# Patient Record
Sex: Female | Born: 1995 | Hispanic: No | Marital: Single | Smoking: Former smoker
Health system: Southern US, Community
[De-identification: ages and names within clinical notes are randomized; demographics above are authoritative.]

## PROBLEM LIST (undated history)

## (undated) DIAGNOSIS — F329 Major depressive disorder, single episode, unspecified: Secondary | ICD-10-CM

## (undated) DIAGNOSIS — F419 Anxiety disorder, unspecified: Secondary | ICD-10-CM

## (undated) DIAGNOSIS — F32A Depression, unspecified: Secondary | ICD-10-CM

## (undated) DIAGNOSIS — Q51810 Arcuate uterus: Secondary | ICD-10-CM

## (undated) DIAGNOSIS — G43909 Migraine, unspecified, not intractable, without status migrainosus: Secondary | ICD-10-CM

## (undated) HISTORY — DX: Depression, unspecified: F32.A

## (undated) HISTORY — DX: Anxiety disorder, unspecified: F41.9

## (undated) HISTORY — DX: Arcuate uterus: Q51.810

## (undated) HISTORY — DX: Major depressive disorder, single episode, unspecified: F32.9

## (undated) HISTORY — DX: Migraine, unspecified, not intractable, without status migrainosus: G43.909

---

## 2017-04-16 ENCOUNTER — Ambulatory Visit (INDEPENDENT_AMBULATORY_CARE_PROVIDER_SITE_OTHER): Payer: Managed Care, Other (non HMO) | Admitting: Family Medicine

## 2017-04-16 ENCOUNTER — Ambulatory Visit: Payer: Self-pay | Admitting: Physician Assistant

## 2017-04-16 ENCOUNTER — Encounter: Payer: Self-pay | Admitting: Family Medicine

## 2017-04-16 DIAGNOSIS — F321 Major depressive disorder, single episode, moderate: Secondary | ICD-10-CM | POA: Insufficient documentation

## 2017-04-16 DIAGNOSIS — F329 Major depressive disorder, single episode, unspecified: Secondary | ICD-10-CM | POA: Diagnosis not present

## 2017-04-16 DIAGNOSIS — F419 Anxiety disorder, unspecified: Secondary | ICD-10-CM

## 2017-04-16 DIAGNOSIS — G43909 Migraine, unspecified, not intractable, without status migrainosus: Secondary | ICD-10-CM | POA: Insufficient documentation

## 2017-04-16 DIAGNOSIS — F909 Attention-deficit hyperactivity disorder, unspecified type: Secondary | ICD-10-CM | POA: Diagnosis not present

## 2017-04-16 MED ORDER — LISDEXAMFETAMINE DIMESYLATE 30 MG PO CAPS: 30.0000 mg | ORAL_CAPSULE | Freq: Every day | ORAL | 0 refills | Status: DC

## 2017-04-16 MED ORDER — BUSPIRONE HCL 7.5 MG PO TABS: 7.5000 mg | ORAL_TABLET | Freq: Three times a day (TID) | ORAL | 1 refills | Status: DC

## 2017-04-16 NOTE — Assessment & Plan Note (Signed)
Refill for vyvanse given today. Controlled substance contract reviewed and signed today. Database reviewed without red flags. Informed patient that we would not go to get further refills until obtaining her prior records. Follow-up in one month.

## 2017-04-16 NOTE — Patient Instructions (Signed)
Start the buspar.  Come back to see me in about a month, or sooner as needed.   Take care,  Dr Jimmey Ralph

## 2017-04-16 NOTE — Progress Notes (Signed)
Subjective:  Felicia Ellison is a 21 y.o. female who presents today with a chief complaint of ADHD and to establish care.   HPI:  ADHD, Chronic, New Problem to this provider Patient was diagnosed a few years ago while she was in high school. She has only been on vyvanse in the past. Currently she is on vyvanse  daily which helps with her ability to focus on school and work. She is currently working at [employer]. She will be starting school next semester at [employer]. She will be majoring in [place] and focusing in early childhood development.  No chest pain, shortness of breath, palpitations, insomnia, or decreased appetite.   Depression/Anxiety, Chronic, New problem to this provider Patient with several year history of depression and anxiety. Has been tried on Zoloft in the past which helps with her depressive symptoms, however does not help much with her anxiety symptoms. Overall feels like her anxiety is worse than her depression. Is not currently taking any medication. She does report increased irritability, increased appetite, and sometimes difficulty with sleep. Also reports excessive worry about several things in her life however is usually most focused on her state of health.   Depression screen PHQ 2/9 04/16/2017  Decreased Interest 1  Down, Depressed, Hopeless 2  PHQ - 2 Score 3  Altered sleeping 3  Tired, decreased energy 2  Change in appetite 3  Feeling bad or failure about yourself  2  Trouble concentrating 2  Moving slowly or fidgety/restless 3  Suicidal thoughts 1  PHQ-9 Score 19  Difficult doing work/chores Somewhat difficult    GAD 7 : Generalized Anxiety Score 04/16/2017  Nervous, Anxious, on Edge 2  Control/stop worrying 3  Worry too much - different things 3  Trouble relaxing 2  Restless 1  Easily annoyed or irritable 2  Afraid - awful might happen 3  Total GAD 7 Score 16  Anxiety Difficulty Somewhat difficult   ROS: No SI or HI.  ROS: Per HPI,  otherwise a 14 point review of systems was performed and was negative  PMH:  The following were reviewed and entered/updated in epic: History reviewed. No pertinent past medical history. Patient Active Problem List   Diagnosis Date Noted  . ADHD 04/16/2017  . Migraines 04/16/2017  . Anxiety and depression 04/16/2017   History reviewed. No pertinent surgical history.  Family History  Problem Relation Age of Onset  . Hypothyroidism Mother   . Anxiety disorder Mother   . Anxiety disorder Father   . Cancer Maternal Grandmother        Brain  . Hypertension Paternal Grandmother   . Heart attack Paternal Grandfather    Medications- reviewed and updated Current Outpatient Prescriptions  Medication Sig Dispense Refill  . lisdexamfetamine (VYVANSE) 30 MG capsule Take 1 capsule (30 mg total) by mouth daily. 30 capsule 0  . busPIRone (BUSPAR) 7.5 MG tablet Take 1 tablet (7.5 mg total) by mouth 3 (three) times daily. 90 tablet 1   No current facility-administered medications for this visit.    Allergies-reviewed and updated No Known Allergies  Social History   Social History  . Marital status: Single    Spouse name: N/A  . Number of children: N/A  . Years of education: N/A   Occupational History  . Party Host    Social History Main Topics  . Smoking status: Current Every Day Smoker  . Smokeless tobacco: Never Used  . Alcohol use Yes  . Drug use: No  .  Sexual activity: Yes    Partners: Female   Other Topics Concern  . None   Social History Narrative  . None   Objective:  Physical Exam: BP 110/80   Pulse 86   Ht  (1.651 m)   Wt 198 lb 9.6 oz (90.1 kg)   SpO2 99%   Breastfeeding? Unknown   BMI 33.05 kg/m   Gen: NAD, resting comfortably CV: RRR with no murmurs appreciated Pulm: NWOB, CTAB with no crackles, wheezes, or rhonchi GI: Normal bowel sounds present. Soft, Nontender, Nondistended. MSK: No edema, cyanosis, or clubbing noted Skin: Warm, dry Neuro:  Grossly normal, moves all extremities Psych: Normal affect and thought content  Assessment/Plan:  ADHD Refill for vyvanse given today. Controlled substance contract reviewed and signed today. Database reviewed without red flags. Informed patient that we would not go to get further refills until obtaining her prior records. Follow-up in one month.  Anxiety and depression PHQ and GAD elevated today. Discussed treatment options with patient. She deferred starting SSRI/SNRI today. Will start buspar today. Follow up in 1 month. Consider addition of SSRI/SNRI if symptoms are not improving on buspar alone. Referral to behavioral health placed today.   Obtain records from her previous PCP. Consider CBC and TSH in the near future if it has not been done recently.  Preventative Healthcare Flu shot deferred today.  Katina Degree. Jimmey Ralph, MD 04/16/2017 1:53 PM

## 2017-04-16 NOTE — Assessment & Plan Note (Addendum)
PHQ and GAD elevated today. Discussed treatment options with patient. She deferred starting SSRI/SNRI today. Will start buspar today. Follow up in 1 month. Consider addition of SSRI/SNRI if symptoms are not improving on buspar alone. Referral to behavioral health placed today.   Obtain records from her previous PCP. Consider CBC and TSH in the near future if it has not been done recently.

## 2017-05-09 ENCOUNTER — Telehealth: Payer: Self-pay | Admitting: Family Medicine

## 2017-05-09 NOTE — Telephone Encounter (Signed)
ROI faxed to [facility]

## 2017-05-16 ENCOUNTER — Telehealth: Payer: Self-pay | Admitting: Family Medicine

## 2017-05-16 NOTE — Telephone Encounter (Signed)
MEDICATION: lisdexamfetamine (VYVANSE) 30 MG capsule    PHARMACY: picking up paper script   IS THIS A 90 DAY SUPPLY : no  IS PATIENT OUT OF MEDICATION: no  IF NOT; HOW MUCH IS LEFT: 1 week  LAST APPOINTMENT DATE: @  NEXT APPOINTMENT DATE:@11 /16/2018 m OTHER COMMENTS: Wants to know if she can pick it up tomorrow   **Let patient know to contact pharmacy at the end of the day to make sure medication is ready. **  ** Please notify patient to allow 48-72 hours to process**  **Encourage patient to contact the pharmacy for refills or they can request refills through [facility]**

## 2017-05-17 ENCOUNTER — Ambulatory Visit (INDEPENDENT_AMBULATORY_CARE_PROVIDER_SITE_OTHER): Payer: BLUE CROSS/BLUE SHIELD | Admitting: Family Medicine

## 2017-05-17 ENCOUNTER — Encounter: Payer: Self-pay | Admitting: Family Medicine

## 2017-05-17 VITALS — BP 100/80 | HR 75 | Ht 65.0 in | Wt 190.8 lb

## 2017-05-17 DIAGNOSIS — F321 Major depressive disorder, single episode, moderate: Secondary | ICD-10-CM | POA: Diagnosis not present

## 2017-05-17 DIAGNOSIS — N946 Dysmenorrhea, unspecified: Secondary | ICD-10-CM

## 2017-05-17 DIAGNOSIS — F419 Anxiety disorder, unspecified: Secondary | ICD-10-CM | POA: Insufficient documentation

## 2017-05-17 DIAGNOSIS — F909 Attention-deficit hyperactivity disorder, unspecified type: Secondary | ICD-10-CM

## 2017-05-17 MED ORDER — LISDEXAMFETAMINE DIMESYLATE 30 MG PO CAPS: 30.0000 mg | ORAL_CAPSULE | Freq: Every day | ORAL | 0 refills | Status: DC

## 2017-05-17 NOTE — Patient Instructions (Signed)
We will continue the vyvanse.  Let me know how your visit with Misty StanleyLisa goes.  I placed a referal to a gynecologist.  Come back to see me in 3 months, or sooner as needed.  Take care,  Dr Jimmey RalphParker

## 2017-05-17 NOTE — Assessment & Plan Note (Signed)
See depression A/P. 

## 2017-05-17 NOTE — Assessment & Plan Note (Signed)
Vyvanse given today.  Database reviewed without red flags. F/u 3 months.

## 2017-05-17 NOTE — Progress Notes (Signed)
    Subjective:  Felicia Ellison is a 21 y.o. female who presents today with a chief complaint of ADHD follow-up.   HPI:  ADHD, chronic problem, stable Currently on Vyvanse 30 mg daily.  She does well on this dose.  She recently changed jobs from [employer] to [employer] and thinks it is been a good change.  Medications help her with her ability to function at work.  No palpitations.  No chest pain.    Dysmenorrhea, chronic problem, new this provider Several year history.  Patient has irregular, long and painful cycles.  She has been evaluated by gynecology in the past and has been on oral birth control pills which did not significantly seem to help.  She would like to be referred to gynecologist for further management.  Depression/Anxiety, Chronic, Stable Patient was seen about a month ago and started on BuSpar.  Reports that she did not tolerate this dose and noticed dizziness for about 30 minutes after taking medications.  She has not been on anything for a couple weeks.  Currently is not interested in pharmacotherapy, she has an appointment with our therapist in a few weeks and would like to wait till after that visit to determine if she needs any further medication.  School is going well.  She thinks that her recent switch of jobs has been helping with her anxiety and depression.  No SI or HI.  ROS: Per HPI  PMH: Smoking history reviewed.  Current smoker.  Objective:  Physical Exam: BP 100/80   Pulse 75   Ht 5\' 5"  (1.651 m)   Wt 190 lb 12.8 oz (86.5 kg)   SpO2 99%   BMI 31.75 kg/m   Gen: NAD, resting comfortably Skin: Warm, dry Neuro: Grossly normal, moves all extremities Psych: Normal affect and thought content   Assessment/Plan:  ADHD Vyvanse given today.  Database reviewed without red flags. F/u 3 months.   Depression, major, single episode, moderate (HCC) Patient deferred pharmacological therapy today.  No SI or HI.  She will be seeing her therapist in a few weeks.  Advised  patient return within the next few weeks depending on how her visit with a therapist ago.  Consider starting SSRI if symptoms not well managed with psychotherapy alone.  Anxiety See depression A/P.  Dysmenorrhea Referral to OB/GYN placed.  Patient would be a good candidate for IUD placement.  Encouraged use of NSAIDs as needed.  [name], MD 05/17/2017 5:18 PM

## 2017-05-17 NOTE — Telephone Encounter (Signed)
Pending appt today

## 2017-05-17 NOTE — Assessment & Plan Note (Signed)
Patient deferred pharmacological therapy today.  No SI or HI.  She will be seeing her therapist in a few weeks.  Advised patient return within the next few weeks depending on how her visit with a therapist ago.  Consider starting SSRI if symptoms not well managed with psychotherapy alone.

## 2017-06-04 ENCOUNTER — Ambulatory Visit (INDEPENDENT_AMBULATORY_CARE_PROVIDER_SITE_OTHER): Payer: 59 | Admitting: Psychology

## 2017-06-04 DIAGNOSIS — F411 Generalized anxiety disorder: Secondary | ICD-10-CM | POA: Diagnosis not present

## 2017-06-14 ENCOUNTER — Other Ambulatory Visit: Payer: Self-pay | Admitting: Family Medicine

## 2017-06-14 NOTE — Telephone Encounter (Signed)
Copied from CRM [id number]. Topic: Quick Communication - Rx Refill/Question >> Jun 14, 2017  9:32 AM Oneal GroutSebastian, Jennifer S wrote: Has the patient contacted their pharmacy? No, patient states she must request from us and pick up in office  (Agent: If no, request that the patient contact the pharmacy for the refill.)   [facility]erred [facility] (with phone number [facility]    Agent: Please be advised that RX refills may take up to 3 business days. We ask that you follow-up with your pharmacy. Requesting refill on lisdexamfetamine (VYVANSE) 30 MG capsule

## 2017-06-17 ENCOUNTER — Telehealth: Payer: Self-pay | Admitting: Family Medicine

## 2017-06-17 MED ORDER — LISDEXAMFETAMINE DIMESYLATE 30 MG PO CAPS: 30.0000 mg | ORAL_CAPSULE | Freq: Every day | ORAL | 0 refills | Status: DC

## 2017-06-17 NOTE — Telephone Encounter (Signed)
I sent this into the pharmacy earlier this morning. She should check with them to make sure they got it.  [name], MD 06/17/2017 12:11 PM

## 2017-06-17 NOTE — Addendum Note (Signed)
Addended by: Ardith DarkPARKER, Londyn Wotton M on: 06/17/2017 09:58 AM   Modules accepted: Orders

## 2017-06-17 NOTE — Telephone Encounter (Signed)
Rx sent in electronically.   [name], MD 06/17/2017 9:57 AM

## 2017-06-17 NOTE — Telephone Encounter (Signed)
Copied from CRM [id number]. Topic: Quick Communication - Rx Refill/Question >> Jun 14, 2017  9:32 AM Oneal GroutSebastian, Jennifer S wrote: Has the patient contacted their pharmacy? No, patient states she must request from us and pick up in office  (Agent: If no, request that the patient contact the pharmacy for the refill.)   [facility]    Agent: Please be advised that RX refills may take up to 3 business days. We ask that you follow-up with your pharmacy. Requesting refill on lisdexamfetamine (VYVANSE) 30 MG capsule >> Jun 17, 2017 10:55 AM Oneal GroutSebastian, Jennifer S wrote: Checking status? Please advise [phone]

## 2017-06-18 ENCOUNTER — Ambulatory Visit: Payer: Managed Care, Other (non HMO) | Admitting: Psychology

## 2017-06-18 NOTE — Telephone Encounter (Signed)
Called patient and let her know that her prescription was sent in to pharmacy.

## 2017-06-19 NOTE — Telephone Encounter (Signed)
Left message for patient to return call to make sure she is aware her medication was sent to the pharmacy.  Dr. Jimmey RalphParker is now able to e-scribe that medication.  CRM created.

## 2017-06-19 NOTE — Telephone Encounter (Signed)
Called patient she has picked up meds and did not have any questions at this time.

## 2017-07-02 DIAGNOSIS — Q51810 Arcuate uterus: Secondary | ICD-10-CM

## 2017-07-02 HISTORY — DX: Arcuate uterus: Q51.810

## 2017-07-05 ENCOUNTER — Ambulatory Visit (INDEPENDENT_AMBULATORY_CARE_PROVIDER_SITE_OTHER): Payer: 59 | Admitting: Psychology

## 2017-07-05 DIAGNOSIS — F411 Generalized anxiety disorder: Secondary | ICD-10-CM

## 2017-07-10 ENCOUNTER — Other Ambulatory Visit: Payer: Self-pay | Admitting: Family Medicine

## 2017-07-11 MED ORDER — LISDEXAMFETAMINE DIMESYLATE 30 MG PO CAPS: 30.0000 mg | ORAL_CAPSULE | Freq: Every day | ORAL | 0 refills | Status: DC

## 2017-07-11 NOTE — Telephone Encounter (Signed)
Patient has follow up scheduled in February.

## 2017-07-11 NOTE — Telephone Encounter (Signed)
Please advise 

## 2017-07-15 ENCOUNTER — Other Ambulatory Visit: Payer: Self-pay | Admitting: Family Medicine

## 2017-07-15 ENCOUNTER — Ambulatory Visit (INDEPENDENT_AMBULATORY_CARE_PROVIDER_SITE_OTHER): Payer: 59 | Admitting: Psychology

## 2017-07-15 DIAGNOSIS — F411 Generalized anxiety disorder: Secondary | ICD-10-CM | POA: Diagnosis not present

## 2017-07-15 MED ORDER — LISDEXAMFETAMINE DIMESYLATE 30 MG PO CAPS: 30.0000 mg | ORAL_CAPSULE | Freq: Every day | ORAL | 0 refills | Status: DC

## 2017-07-15 NOTE — Progress Notes (Signed)
Reordering vyvanse - was not properly received by pharmacy.  [name], MD 07/15/2017 9:53 AM

## 2017-08-03 ENCOUNTER — Other Ambulatory Visit: Payer: Self-pay | Admitting: Family Medicine

## 2017-08-04 ENCOUNTER — Other Ambulatory Visit: Payer: Self-pay | Admitting: Family Medicine

## 2017-08-05 ENCOUNTER — Ambulatory Visit (INDEPENDENT_AMBULATORY_CARE_PROVIDER_SITE_OTHER): Payer: 59 | Admitting: Psychology

## 2017-08-05 DIAGNOSIS — F411 Generalized anxiety disorder: Secondary | ICD-10-CM | POA: Diagnosis not present

## 2017-08-05 MED ORDER — LISDEXAMFETAMINE DIMESYLATE 30 MG PO CAPS: 30.0000 mg | ORAL_CAPSULE | Freq: Every day | ORAL | 0 refills | Status: DC

## 2017-08-05 NOTE — Telephone Encounter (Signed)
Rx sent in.  [name], MD 08/05/2017 8:39 AM

## 2017-08-05 NOTE — Telephone Encounter (Signed)
Please advise.  Patient has follow up scheduled on 08/19/2017.

## 2017-08-12 ENCOUNTER — Encounter: Payer: Self-pay | Admitting: Family Medicine

## 2017-08-12 ENCOUNTER — Ambulatory Visit: Payer: BLUE CROSS/BLUE SHIELD | Admitting: Family Medicine

## 2017-08-12 ENCOUNTER — Ambulatory Visit (INDEPENDENT_AMBULATORY_CARE_PROVIDER_SITE_OTHER): Payer: 59 | Admitting: Psychology

## 2017-08-12 DIAGNOSIS — F411 Generalized anxiety disorder: Secondary | ICD-10-CM

## 2017-08-13 ENCOUNTER — Ambulatory Visit (INDEPENDENT_AMBULATORY_CARE_PROVIDER_SITE_OTHER): Payer: Managed Care, Other (non HMO) | Admitting: Family Medicine

## 2017-08-13 ENCOUNTER — Encounter: Payer: Self-pay | Admitting: Family Medicine

## 2017-08-13 ENCOUNTER — Other Ambulatory Visit (HOSPITAL_COMMUNITY)
Admission: RE | Admit: 2017-08-13 | Discharge: 2017-08-13 | Disposition: A | Discharge status: Home / Self Care | Payer: BLUE CROSS/BLUE SHIELD | Source: Ambulatory Visit | Attending: Family Medicine | Admitting: Family Medicine

## 2017-08-13 VITALS — BP 110/70 | HR 104 | Temp 98.7°F | Ht 65.0 in | Wt 171.0 lb

## 2017-08-13 DIAGNOSIS — Z23 Encounter for immunization: Secondary | ICD-10-CM

## 2017-08-13 DIAGNOSIS — F909 Attention-deficit hyperactivity disorder, unspecified type: Secondary | ICD-10-CM

## 2017-08-13 DIAGNOSIS — Z114 Encounter for screening for human immunodeficiency virus [HIV]: Secondary | ICD-10-CM | POA: Diagnosis not present

## 2017-08-13 DIAGNOSIS — Z118 Encounter for screening for other infectious and parasitic diseases: Secondary | ICD-10-CM

## 2017-08-13 NOTE — Assessment & Plan Note (Signed)
Stable.  Continue Vyvanse.  Follow-up in 6 months.

## 2017-08-13 NOTE — Patient Instructions (Addendum)
Come back to see me in 6 months, or sooner as needed.  Take care, Dr Lynnell DikeParkre

## 2017-08-13 NOTE — Progress Notes (Signed)
    Subjective:  Felicia Ellison is a 22 y.o. female who presents today with a chief complaint of ADHD.   HPI:  ADHD, established problem, stable On Vyvanse 30 mg daily.  Tolerates this dose well without side effects.  No chest pain or shortness of breath.  No palpitations.  No weight loss.  No irritability or insomnia.  Helps her perform at work.  Currently works at [employer].  ROS: Per HPI  Objective:  Physical Exam: BP 110/70 (BP Location: Left Arm, Patient Position: Sitting, Cuff Size: Normal)   Pulse (!) 104   Temp 98.7 F (37.1 C) (Oral)   Ht 5\' 5"  (1.651 m)   Wt 171 lb (77.6 kg)   LMP 08/06/2017   SpO2 97%   BMI 28.46 kg/m   Gen: NAD, resting comfortably CV: RRR with no murmurs appreciated Pulm: NWOB, CTAB with no crackles, wheezes, or rhonchi  Assessment/Plan:  ADHD Stable.  Continue Vyvanse.  Follow-up in 6 months.  Preventive healthcare Check chlamydia screen.  Check HIV antibody.  Tdap given today.  [name], MD 08/13/2017 3:51 PM

## 2017-08-14 ENCOUNTER — Other Ambulatory Visit (INDEPENDENT_AMBULATORY_CARE_PROVIDER_SITE_OTHER): Payer: BLUE CROSS/BLUE SHIELD

## 2017-08-14 DIAGNOSIS — Z114 Encounter for screening for human immunodeficiency virus [HIV]: Secondary | ICD-10-CM | POA: Diagnosis not present

## 2017-08-15 LAB — HIV ANTIBODY (ROUTINE TESTING W REFLEX): HIV: NONREACTIVE

## 2017-08-15 LAB — URINE CYTOLOGY ANCILLARY ONLY: Chlamydia: NEGATIVE

## 2017-08-15 NOTE — Progress Notes (Signed)
Dr Lavone NeriParker's interpretation of your lab work:  Your HIV screening test is negative.    If you have any additional questions, please give us a call or send us a message through [facility].  Take care, Dr Jimmey RalphParker

## 2017-08-15 NOTE — Progress Notes (Signed)
Dr Lavone NeriParker's interpretation of your lab work:  Your screening test for chlamydia is negative.    If you have any additional questions, please give us a call or send us a message through [facility].  Take care, Dr Jimmey RalphParker

## 2017-08-19 ENCOUNTER — Ambulatory Visit: Payer: BLUE CROSS/BLUE SHIELD | Admitting: Family Medicine

## 2017-08-19 ENCOUNTER — Ambulatory Visit (INDEPENDENT_AMBULATORY_CARE_PROVIDER_SITE_OTHER): Payer: 59 | Admitting: Psychology

## 2017-08-19 DIAGNOSIS — Z0289 Encounter for other administrative examinations: Secondary | ICD-10-CM

## 2017-08-19 DIAGNOSIS — F411 Generalized anxiety disorder: Secondary | ICD-10-CM

## 2017-08-21 ENCOUNTER — Encounter: Payer: Self-pay | Admitting: Family Medicine

## 2017-08-23 ENCOUNTER — Ambulatory Visit (INDEPENDENT_AMBULATORY_CARE_PROVIDER_SITE_OTHER): Payer: Managed Care, Other (non HMO) | Admitting: Obstetrics and Gynecology

## 2017-08-23 ENCOUNTER — Encounter: Payer: Self-pay | Admitting: Obstetrics and Gynecology

## 2017-08-23 ENCOUNTER — Other Ambulatory Visit: Payer: Self-pay

## 2017-08-23 VITALS — BP 114/72 | HR 72 | Resp 16 | Ht 65.0 in | Wt 169.0 lb

## 2017-08-23 DIAGNOSIS — N941 Unspecified dyspareunia: Secondary | ICD-10-CM

## 2017-08-23 DIAGNOSIS — N926 Irregular menstruation, unspecified: Secondary | ICD-10-CM

## 2017-08-23 DIAGNOSIS — N921 Excessive and frequent menstruation with irregular cycle: Secondary | ICD-10-CM | POA: Diagnosis not present

## 2017-08-23 DIAGNOSIS — N946 Dysmenorrhea, unspecified: Secondary | ICD-10-CM

## 2017-08-23 DIAGNOSIS — Z113 Encounter for screening for infections with a predominantly sexual mode of transmission: Secondary | ICD-10-CM

## 2017-08-23 NOTE — Progress Notes (Signed)
22 y.o. 720P0 Single Caucasian female here for dysmenorrhea and pain during and after intercourse.    Menarche age 22.   Weighed 220 in July 2018 [place] Felt depressed and lost weight.  Depression is better.  Sees Samule DryLisa Forez, psychologist at [facility].   Can skip  1 - 2 months.  Out of 12 months, have 6 - 7 menses.  Flow can require tampon change up to every 1 - 2 hours.  Painful menses. Uses Ibuprofen or Excedrin almost daily due to her headaches.   This can help her menstrual pain.  Hot bath is the only thing that helps on her worst day.  Nausea, vomiting, and migraine with menses.   Tired birth control pills which did not help her pain, but this was 2 - 3 years ago.  Could not remember them and had break through bleeding.   No loss of vision.  No nipple discharge.  No constipation or cold intolerance.  No excessive hair growth.   No personal hx of liver disease, DVT/PE, HTN.   Hx of migraines and menstrual migraines.  No aura.  Photophobia.   Feels really tired.   Pain also during and after sex.  Not using any instrumentation.  Pain is more internal.  Lubricant helps a little bit.  Has lower abdominal cramping afterward.   Did HIV screening at her PCP.  Nothing more.   PCP: Jacquiline Doealeb Parker, MD  Patient's last menstrual period was 08/06/2017.     Period Cycle (Days): (irregular) Period Duration (Days): 6-13 Period Pattern: (!) Irregular Menstrual Flow: Heavy Menstrual Control: Tampon Menstrual Control Change Freq (Hours): 5 Dysmenorrhea: (!) Severe Dysmenorrhea Symptoms: Cramping, Nausea, Diarrhea, Headache     Sexually active: Yes.   --female partner The current method of family planning is none.    Exercising: No.    Smoker:  Former cigarette smoker--now vapes  Health Maintenance: Pap:  never History of abnormal Pap:  n/a MMG:  n/a Colonoscopy:  n/a BMD:   n/a  Result  n/a TDaP:  08-17-17 Gardasil:   May have done 1  injection HIV: tested last week--hasn't received results Hep C: no     reports that she quit smoking about 13 months ago. Her smoking use included cigarettes. she has never used smokeless tobacco. She reports that she drinks alcohol. She reports that she does not use drugs.  Past Medical History:  Diagnosis Date  . Anxiety   . Depression     History reviewed. No pertinent surgical history.  Current Outpatient Medications  Medication Sig Dispense Refill  . lisdexamfetamine (VYVANSE) 30 MG capsule Take 1 capsule (30 mg total) by mouth daily. 30 capsule 0   No current facility-administered medications for this visit.     Family History  Problem Relation Age of Onset  . Hypothyroidism Mother   . Anxiety disorder Mother   . Anxiety disorder Father   . Cancer Maternal Grandmother        Brain  . Hypertension Paternal Grandmother   . Heart attack Paternal Grandfather     ROS:  Pertinent items are noted in HPI.  Otherwise, a comprehensive ROS was negative.  Exam:   BP 114/72 (BP Location: Right Arm, Patient Position: Sitting, Cuff Size: Normal)   Pulse 72   Resp 16   Ht 5\' 5"  (1.651 m)   Wt 169 lb (76.7 kg)   LMP 08/06/2017   BMI 28.12 kg/m     General appearance: alert,  cooperative and appears stated age Head: Normocephalic, without obvious abnormality, atraumatic Neck: no adenopathy, supple, symmetrical, trachea midline and thyroid normal to inspection and palpation Lungs: clear to auscultation bilaterally Breasts: normal appearance, no masses or tenderness, No nipple retraction or dimpling, No nipple discharge or bleeding, No axillary or supraclavicular adenopathy Heart: regular rate and rhythm Abdomen: soft, non-tender; no masses, no organomegaly Extremities: extremities normal, atraumatic, no cyanosis or edema Skin: Skin color, texture, turgor normal. No rashes or lesions Lymph nodes: Cervical, supraclavicular, and axillary nodes normal. No abnormal inguinal nodes  palpated Neurologic: Grossly normal  Pelvic: External genitalia:  no lesions              Urethra:  normal appearing urethra with no masses, tenderness or lesions              Bartholins and Skenes: normal                 Vagina: normal appearing vagina with normal color and discharge, no lesions              Cervix: no lesions                Bimanual Exam:  Uterus:  normal size, contour, position, consistency, mobility, non-tender              Adnexa: no mass, fullness, tenderness               Chaperone was present for exam.  Assessment:     Recent weight loss.  Menorrhagia with irregular menses.  Severe dysmenorrhea. Migraines without aura.  Menstrual migraines.  Need for STD screening.   Plan:   We discussed painful sex, dysmenorrhea, and her irregular cycles. LH, FSH, estradiol, prolactin, TSH.  CBC.  Complete STD screening.  Pelvic US. May consider continuous OCPs. Follow up annually and prn.   __30_____ minutes face to face time of which over 50% was spent in counseling.    After visit summary provided.

## 2017-08-23 NOTE — Patient Instructions (Signed)
Menorrhagia Menorrhagia is a condition in which menstrual periods are heavy or last longer than normal. With menorrhagia, most periods a woman has may cause enough blood loss and cramping that she becomes unable to take part in her usual activities. What are the causes? Common causes of this condition include:  Noncancerous growths in the uterus (polyps or fibroids).  An imbalance of the estrogen and progesterone hormones.  One of the ovaries not releasing an egg during one or more months.  A problem with the thyroid gland (hypothyroid).  Side effects of having an intrauterine device (IUD).  Side effects of some medicines, such as anti-inflammatory medicines or blood thinners.  A bleeding disorder that stops the blood from clotting normally.  In some cases, the cause of this condition is not known. What are the signs or symptoms? Symptoms of this condition include:  Routinely having to change your pad or tampon every 1-2 hours because it is completely soaked.  Needing to use pads and tampons at the same time because of heavy bleeding.  Needing to wake up to change your pads or tampons during the night.  Passing blood clots larger than 1 inch (2.5 cm) in size.  Having bleeding that lasts for more than 7 days.  Having symptoms of low iron levels (anemia), such as tiredness, fatigue, or shortness of breath.  How is this diagnosed? This condition may be diagnosed based on:  A physical exam.  Your symptoms and menstrual history.  Tests, such as: ? Blood tests to check if you are pregnant or have hormonal changes, a bleeding or thyroid disorder, anemia, or other problems. ? Pap test to check for cancerous changes, infections, or inflammation. ? Endometrial biopsy. This test involves removing a tissue sample from the lining of the uterus (endometrium) to be examined under a microscope. ? Pelvic ultrasound. This test uses sound waves to create images of your uterus, ovaries, and  vagina. The images can show if you have fibroids or other growths. ? Hysteroscopy. For this test, a small telescope is used to look inside your uterus.  How is this treated? Treatment may not be needed for this condition. If it is needed, the best treatment for you will depend on:  Whether you need to prevent pregnancy.  Your desire to have children in the future.  The cause and severity of your bleeding.  Your personal preference.  Medicines are the first step in treatment. You may be treated with:  Hormonal birth control methods. These treatments reduce bleeding during your menstrual period. They include: ? Birth control pills. ? Skin patch. ? Vaginal ring. ? Shots (injections) that you get every 3 months. ? Hormonal IUD (intrauterine device). ? Implants that go under the skin.  Medicines that thicken blood and slow bleeding.  Medicines that reduce swelling, such as ibuprofen.  Medicines that contain an artificial (synthetic) hormone called progestin.  Medicines that make the ovaries stop working for a short time.  Iron supplements to treat anemia.  If medicines do not work, surgery may be done. Surgical options may include:  Dilation and curettage (D&C). In this procedure, your health care provider opens (dilates) your cervix and then scrapes or suctions tissue from the endometrium to reduce menstrual bleeding.  Operative hysteroscopy. In this procedure, a small tube with a light on the end (hysteroscope) is used to view your uterus and help remove polyps that may be causing heavy periods.  Endometrial ablation. This is when various techniques are used to permanently   destroy your entire endometrium. After endometrial ablation, most women have little or no menstrual flow. This procedure reduces your ability to become pregnant.  Endometrial resection. In this procedure, an electrosurgical wire loop is used to remove the endometrium. This procedure reduces your ability to  become pregnant.  Hysterectomy. This is surgical removal of the uterus. This is a permanent procedure that stops menstrual periods. Pregnancy is not possible after a hysterectomy.  Follow these instructions at home: Medicines  Take over-the-counter and prescription medicines exactly as told by your health care provider. This includes iron pills.  Do not change or switch medicines without asking your health care provider.  Do not take aspirin or medicines that contain aspirin 1 week before or during your menstrual period. Aspirin may make bleeding worse. General instructions  If you need to change your sanitary pad or tampon more than once every 2 hours, limit your activity until the bleeding stops.  Iron pills can cause constipation. To prevent or treat constipation while you are taking prescription iron supplements, your health care provider may recommend that you: ? Drink enough fluid to keep your urine clear or pale yellow. ? Take over-the-counter or prescription medicines. ? Eat foods that are high in fiber, such as fresh fruits and vegetables, whole grains, and beans. ? Limit foods that are high in fat and processed sugars, such as fried and sweet foods.  Eat well-balanced meals, including foods that are high in iron. Foods that have a lot of iron include leafy green vegetables, meat, liver, eggs, and whole grain breads and cereals.  Do not try to lose weight until the abnormal bleeding has stopped and your blood iron level is back to normal. If you need to lose weight, work with your health care provider to lose weight safely.  Keep all follow-up visits as told by your health care provider. This is important. Contact a health care provider if:  You soak through a pad or tampon every 1 or 2 hours, and this happens every time you have a period.  You need to use pads and tampons at the same time because you are bleeding so much.  You have nausea, vomiting, diarrhea, or other  problems related to medicines you are taking. Get help right away if:  You soak through more than a pad or tampon in 1 hour.  You pass clots bigger than 1 inch (2.5 cm) wide.  You feel short of breath.  You feel like your heart is beating too fast.  You feel dizzy or faint.  You feel very weak or tired. Summary  Menorrhagia is a condition in which menstrual periods are heavy or last longer than normal.  Treatment will depend on the cause of the condition and may include medicines or procedures.  Take over-the-counter and prescription medicines exactly as told by your health care provider. This includes iron pills.  Get help right away if you have heavy bleeding that soaks through more than a pad or tampon in 1 hour, you are passing large clots, or you feel dizzy, faint or short of breath. This information is not intended to replace advice given to you by your health care provider. Make sure you discuss any questions you have with your health care provider. Document Released: 06/18/2005 Document Revised: 06/11/2016 Document Reviewed: 06/11/2016 Elsevier Interactive Patient Education  2018 [employer] Dysmenorrhea Menstrual cramps (dysmenorrhea) are caused by the muscles of the uterus tightening (contracting) during a menstrual period. For some women, this discomfort is  dysmenorrhea can be severe enough to interfere with everyday activities for a few days each month. Primary dysmenorrhea is menstrual cramps that last a couple of days when you start having menstrual periods or soon after. This often begins after a teenager starts having her period. As a woman gets older or has a baby, the cramps will usually lessen or disappear. Secondary dysmenorrhea begins later in life, lasts longer, and the pain may be stronger than primary dysmenorrhea. The pain may start before the period and last a few days after the period. What are the causes? Dysmenorrhea is usually  caused by an underlying problem, such as:  The tissue lining the uterus grows outside of the uterus in other areas of the body (endometriosis).  The endometrial tissue, which normally lines the uterus, is found in or grows into the muscular walls of the uterus (adenomyosis).  The pelvic blood vessels are engorged with blood just before the menstrual period (pelvic congestive syndrome).  Overgrowth of cells (polyps) in the lining of the uterus or cervix.  Falling down of the uterus (prolapse) because of loose or stretched ligaments.  Depression.  Bladder problems, infection, or inflammation.  Problems with the intestine, a tumor, or irritable bowel syndrome.  Cancer of the female organs or bladder.  A severely tipped uterus.  A very tight opening or closed cervix.  Noncancerous tumors of the uterus (fibroids).  Pelvic inflammatory disease (PID).  Pelvic scarring (adhesions) from a previous surgery.  Ovarian cyst.  An intrauterine device (IUD) used for birth control. What increases the risk? You may be at greater risk of dysmenorrhea if:  You are younger than age 30.  You started puberty early.  You have irregular or heavy bleeding.  You have never given birth.  You have a family history of this problem.  You are a smoker. What are the signs or symptoms?  Cramping or throbbing pain in your lower abdomen.  Headaches.  Lower back pain.  Nausea or vomiting.  Diarrhea.  Sweating or dizziness.  Loose stools. How is this diagnosed? A diagnosis is based on your history, symptoms, physical exam, diagnostic tests, or procedures. Diagnostic tests or procedures may include:  Blood tests.  Ultrasonography.  An examination of the lining of the uterus (dilation and curettage, D&C).  An examination inside your abdomen or pelvis with a scope (laparoscopy).  X-rays.  CT scan.  MRI.  An examination inside the bladder with a scope (cystoscopy).  An  examination inside the intestine or stomach with a scope (colonoscopy, gastroscopy). How is this treated? Treatment depends on the cause of the dysmenorrhea. Treatment may include:  Pain medicine prescribed by your health care provider.  Birth control pills or an IUD with progesterone hormone in it.  Hormone replacement therapy.  Nonsteroidal anti-inflammatory drugs (NSAIDs). These may help stop the production of prostaglandins.  Surgery to remove adhesions, endometriosis, ovarian cyst, or fibroids.  Removal of the uterus (hysterectomy).  Progesterone shots to stop the menstrual period.  Cutting the nerves on the sacrum that go to the female organs (presacral neurectomy).  Electric current to the sacral nerves (sacral nerve stimulation).  Antidepressant medicine.  Psychiatric therapy, counseling, or group therapy.  Exercise and physical therapy.  Meditation and yoga therapy.  Acupuncture. Follow these instructions at home:  Only take over-the-counter or prescription medicines as directed by your health care provider.  Place a heating pad or hot water bottle on your lower back or abdomen. Do not sleep with the heating pad.    abdomen. Do not sleep with the heating pad.  Use aerobic exercises, walking, swimming, biking, and other exercises to help lessen the cramping.  Massage to the lower back or abdomen may help.  Stop smoking.  Avoid alcohol and caffeine. Contact a health care provider if:  Your pain does not get better with medicine.  You have pain with sexual intercourse.  Your pain increases and is not controlled with medicines.  You have abnormal vaginal bleeding with your period.  You develop nausea or vomiting with your period that is not controlled with medicine. Get help right away if: You pass out. This information is not intended to replace advice given to you by your health care provider. Make sure you discuss any questions you have with your health care  provider. Document Released: 06/18/2005 Document Revised: 11/24/2015 Document Reviewed: 12/04/2012 Elsevier Interactive Patient Education  2017 [employer]

## 2017-08-24 LAB — CBC
HEMATOCRIT: 41.2 % (ref 34.0–46.6)
HEMOGLOBIN: 13.9 g/dL (ref 11.1–15.9)
MCH: 30.3 pg (ref 26.6–33.0)
MCHC: 33.7 g/dL (ref 31.5–35.7)
MCV: 90 fL (ref 79–97)
Platelets: 292 10*3/uL (ref 150–379)
RBC: 4.59 x10E6/uL (ref 3.77–5.28)
RDW: 13.4 % (ref 12.3–15.4)
WBC: 7.9 10*3/uL (ref 3.4–10.8)

## 2017-08-24 LAB — FSH/LH
FSH: 7.1 m[IU]/mL
LH: 25.5 m[IU]/mL

## 2017-08-24 LAB — ESTRADIOL: Estradiol: 165 pg/mL

## 2017-08-24 LAB — HEPATITIS B SURFACE ANTIGEN: Hepatitis B Surface Ag: NEGATIVE

## 2017-08-24 LAB — PROLACTIN: PROLACTIN: 13.8 ng/mL (ref 4.8–23.3)

## 2017-08-24 LAB — TSH: TSH: 1.09 u[IU]/mL (ref 0.450–4.500)

## 2017-08-24 LAB — HEPATITIS C ANTIBODY

## 2017-08-24 LAB — RPR: RPR: NONREACTIVE

## 2017-08-25 DIAGNOSIS — N926 Irregular menstruation, unspecified: Secondary | ICD-10-CM | POA: Insufficient documentation

## 2017-08-25 DIAGNOSIS — N946 Dysmenorrhea, unspecified: Secondary | ICD-10-CM | POA: Insufficient documentation

## 2017-08-25 DIAGNOSIS — N941 Unspecified dyspareunia: Secondary | ICD-10-CM | POA: Insufficient documentation

## 2017-08-26 ENCOUNTER — Telehealth: Payer: Self-pay | Admitting: Obstetrics and Gynecology

## 2017-08-26 NOTE — Telephone Encounter (Signed)
Spoke with patient in regards to benefits for an ultrasound. 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 Patient to call back to schedule.   cc: Dr Edward JollySilva

## 2017-08-26 NOTE — Telephone Encounter (Signed)
Please keep in your work queue and call patient back in one week if you do not hear from her.

## 2017-08-27 LAB — CHLAMYDIA/GONOCOCCUS/TRICHOMONAS, NAA
Chlamydia by NAA: NEGATIVE
Gonococcus by NAA: NEGATIVE
TRICH VAG BY NAA: NEGATIVE

## 2017-09-02 NOTE — Telephone Encounter (Signed)
Call placed to patient to follow up. Patient was discussed with her mother and is ready to proceed with scheduling. Patient is scheduled 09/12/17 with Dr Edward JollySilva. Patient is aware of the appointment date, arrival time and cancellation policy. Patient had no further questions. Ok to close    cc: Dr Edward JollySilva

## 2017-09-03 ENCOUNTER — Other Ambulatory Visit: Payer: Self-pay | Admitting: Family Medicine

## 2017-09-03 MED ORDER — LISDEXAMFETAMINE DIMESYLATE 30 MG PO CAPS: 30.0000 mg | ORAL_CAPSULE | Freq: Every day | ORAL | 0 refills | Status: DC

## 2017-09-12 ENCOUNTER — Encounter: Payer: Self-pay | Admitting: Obstetrics and Gynecology

## 2017-09-12 ENCOUNTER — Ambulatory Visit (INDEPENDENT_AMBULATORY_CARE_PROVIDER_SITE_OTHER): Payer: Managed Care, Other (non HMO) | Admitting: Obstetrics and Gynecology

## 2017-09-12 ENCOUNTER — Other Ambulatory Visit: Payer: Self-pay

## 2017-09-12 ENCOUNTER — Ambulatory Visit (INDEPENDENT_AMBULATORY_CARE_PROVIDER_SITE_OTHER): Payer: Managed Care, Other (non HMO)

## 2017-09-12 VITALS — BP 112/60 | HR 80 | Resp 16 | Wt 169.0 lb

## 2017-09-12 DIAGNOSIS — N921 Excessive and frequent menstruation with irregular cycle: Secondary | ICD-10-CM

## 2017-09-12 DIAGNOSIS — N941 Unspecified dyspareunia: Secondary | ICD-10-CM

## 2017-09-12 DIAGNOSIS — N946 Dysmenorrhea, unspecified: Secondary | ICD-10-CM

## 2017-09-12 DIAGNOSIS — Q51810 Arcuate uterus: Secondary | ICD-10-CM

## 2017-09-12 DIAGNOSIS — G43829 Menstrual migraine, not intractable, without status migrainosus: Secondary | ICD-10-CM | POA: Diagnosis not present

## 2017-09-12 MED ORDER — LEVONORGEST-ETH ESTRAD 91-DAY 0.15-0.03 &0.01 MG PO TABS: 1.0000 | ORAL_TABLET | Freq: Every day | ORAL | 1 refills | Status: DC

## 2017-09-12 NOTE — Progress Notes (Signed)
GYNECOLOGY  VISIT   HPI: 22 y.o.   Single  Caucasian  female   G0P0 with Patient's last menstrual period was 09/09/2017.   here for ultrasound for dysmenorrhea and pain during and after intercourse.  Skipping menses and does have heavy cycles when they occur. Had significant weight loss.   LH>FSH. Normal estradiol, prolactin, and TSH.  STD testing negative.   Forgot OCPs in the past when she was younger.  GYNECOLOGIC HISTORY: Patient's last menstrual period was 09/09/2017. Contraception:  none Menopausal hormone therapy:  n/a Last mammogram:  n/a Last pap smear:   n/a        OB History    Gravida Para Term Preterm AB Living   0             SAB TAB Ectopic Multiple Live Births                     Patient Active Problem List   Diagnosis Date Noted  . 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 Dysmenorrhea 08/25/2017  . Dyspareunia in female 08/25/2017  . Irregular menses 08/25/2017  . Anxiety 05/17/2017  . ADHD 04/16/2017  . Migraines 04/16/2017  . Depression, major, single episode, moderate (HCC) 04/16/2017    Past Medical History:  Diagnosis Date  . Anxiety   . Arcuate uterus 2019   noted on pelvic ultrasound  . Depression   . Migraines    without aura    History reviewed. No pertinent surgical history.  Current Outpatient Medications  Medication Sig Dispense Refill  . lisdexamfetamine (VYVANSE) 30 MG capsule Take 1 capsule (30 mg total) by mouth daily. 30 capsule 0  . Levonorgestrel-Ethinyl Estradiol (SEASONIQUE) 0.15-0.03 &0.01 MG tablet Take 1 tablet by mouth daily. 1 Package 1   No current facility-administered medications for this visit.      ALLERGIES: Buspar [buspirone]  Family History  Problem Relation Age of Onset  . Hypothyroidism Mother   . Anxiety disorder Mother   . Anxiety disorder Father   . Cancer Maternal Grandmother        Brain  . Hypertension Paternal Grandmother   . Heart attack Paternal Grandfather     Social History    Socioeconomic History  . Marital status: Single    Spouse name: Not on file  . Number of children: Not on file  . Years of education: Not on file  . Highest education level: Not on file  Social Needs  . Financial resource strain: Not on file  . Food insecurity - worry: Not on file  . Food insecurity - inability: Not on file  . Transportation needs - medical: Not on file  . Transportation needs - non-medical: Not on file  Occupational History  . Occupation: [employer]  Tobacco Use  . Smoking status: Former Smoker    Types: Cigarettes    Last attempt to quit: 07/02/2016    Years since quitting: 1.1  . Smokeless tobacco: Never Used  . Tobacco comment: VAPE  Substance and Sexual Activity  . Alcohol use: Yes    Alcohol/week: 1.2 oz    Types: 2 Glasses of wine per week  . Drug use: No  . Sexual activity: Yes    Partners: Female    Birth control/protection: None    Comment: female partner  Other Topics Concern  . Not on file  Social History Narrative  . Not on file    ROS:  Pertinent items are noted in HPI.  PHYSICAL EXAMINATION:  BP 112/60 (BP Location: Right Arm, Patient Position: Sitting, Cuff Size: Normal)   Pulse 80   Resp 16   Wt 169 lb (76.7 kg)   LMP 09/09/2017   BMI 28.12 kg/m     General appearance: alert, cooperative and appears stated age   Pelvic US No myometrial masses.  Arcuate uterus. EMS 3.07. Normal ovaries.  Mild free fluid.  ASSESSMENT  Arcuate uterus. Dysmenorrhea.  Dyspareunia.  Menorrhagia and skipped menses. Migraines without aura.  Menstrual migraines.   PLAN  Discussed arcuate uterus, irregular menses, menorrhagia, dyspareunia, and dysmenorrhea.  We discussed options for treatment including combined OCPs, Depo Provera, Nexplanon, and laparoscopy.  She is not a good candidate for an IUD.  Seasonique 3 month, 1 refill. Instructed in use.  Side effects discussed. I discussed warning signs of stroke, MI,  DVT, and PE. Follow up  in 3 months.    An After Visit Summary was printed and given to the patient.  __25____ minutes face to face time of which over 50% was spent in counseling.

## 2017-09-12 NOTE — Patient Instructions (Signed)
Ethinyl Estradiol; Levonorgestrel tablets What is this medicine? ETHINYL ESTRADIOL; LEVONORGESTREL (ETH in il es tra DYE ole; LEE voh nor jes trel) is an oral contraceptive. It combines two types of female hormones, an estrogen and a progestin. They are used to prevent ovulation and pregnancy. This medicine may be used for other purposes; ask your health care provider or pharmacist if you have questions. COMMON BRAND NAME(S): Alesse, Altavera, Amethia, Amethia Lo, Amethyst, Ashlyna, Aubra-28, Aviane, Camrese, Camrese Lo, Chateal, Daysee, Delyla, Enpresse, FALMINA, Fayosin, Introvale, Isibloom, Jolessa, Kurvelo, Lessina, Levlen, Levlite, LEVONEST, Levonorgestrel/Ethinyl Estradiol, Levora, LoSeasonique, Lutera, Lybrel, MARLISSA, Myzilra, Nordette, Orsythia, Portia, Quartette, Quasense, Seasonale, Seasonique, Setlakin, Sronyx, Tri-Levlen, Triphasil, Trivora, Vienva What should I tell my health care provider before I take this medicine? They need to know if you have or ever had any of these conditions: -abnormal vaginal bleeding -blood vessel disease or blood clots -breast, cervical, endometrial, ovarian, liver, or uterine cancer -diabetes -gallbladder disease -heart disease or recent heart attack -high blood pressure -high cholesterol -kidney disease -liver disease -migraine headaches -stroke -systemic lupus erythematosus (SLE) -tobacco smoker -an unusual or allergic reaction to estrogens, progestins, other medicines, foods, dyes, or preservatives -pregnant or trying to get pregnant -breast-feeding How should I use this medicine? Take this medicine by mouth. To reduce nausea, this medicine may be taken with food. Follow the directions on the prescription label. Take this medicine at the same time each day and in the order directed on the package. Do not take your medicine more often than directed. Contact your pediatrician regarding the use of this medicine in children. Special care may be  needed. This medicine has been used in female children who have started having menstrual periods. A patient package insert for the product will be given with each prescription and refill. Read this sheet carefully each time. The sheet may change frequently. Overdosage: If you think you have taken too much of this medicine contact a poison control center or emergency room at once. NOTE: This medicine is only for you. Do not share this medicine with others. What if I miss a dose? If you miss a dose, refer to the patient information sheet you received with your medicine for direction. If you miss more than one pill, this medicine may not be as effective and you may need to use another form of birth control. What may interact with this medicine? Do not take this medicine with the following medication: -dasabuvir; ombitasvir; paritaprevir; ritonavir -ombitasvir; paritaprevir; ritonavir This medicine may also interact with the following medications: -acetaminophen -antibiotics or medicines for infections, especially rifampin, rifabutin, rifapentine, and griseofulvin, and possibly penicillins or tetracyclines -aprepitant -ascorbic acid (vitamin C) -atorvastatin -barbiturate medicines, such as phenobarbital -bosentan -carbamazepine -caffeine -clofibrate -cyclosporine -dantrolene -doxercalciferol -felbamate -grapefruit juice -hydrocortisone -medicines for anxiety or sleeping problems, such as diazepam or temazepam -medicines for diabetes, including pioglitazone -mineral oil -modafinil -mycophenolate -nefazodone -oxcarbazepine -phenytoin -prednisolone -ritonavir or other medicines for HIV infection or AIDS -rosuvastatin -selegiline -soy isoflavones supplements -St. John's wort -tamoxifen or raloxifene -theophylline -thyroid hormones -topiramate -warfarin This list may not describe all possible interactions. Give your health care provider a list of all the medicines, herbs,  non-prescription drugs, or dietary supplements you use. Also tell them if you smoke, drink alcohol, or use illegal drugs. Some items may interact with your medicine. What should I watch for while using this medicine? Visit your doctor or health care professional for regular checks on your progress. You will need a regular breast and pelvic   exam and Pap smear while on this medicine. Use an additional method of contraception during the first cycle that you take these tablets. If you have any reason to think you are pregnant, stop taking this medicine right away and contact your doctor or health care professional. If you are taking this medicine for hormone related problems, it may take several cycles of use to see improvement in your condition. Smoking increases the risk of getting a blood clot or having a stroke while you are taking birth control pills, especially if you are more than 20 years old. You are strongly advised not to smoke. This medicine can make your body retain fluid, making your fingers, hands, or ankles swell. Your blood pressure can go up. Contact your doctor or health care professional if you feel you are retaining fluid. This medicine can make you more sensitive to the sun. Keep out of the sun. If you cannot avoid being in the sun, wear protective clothing and use sunscreen. Do not use sun lamps or tanning beds/booths. If you wear contact lenses and notice visual changes, or if the lenses begin to feel uncomfortable, consult your eye care specialist. In some women, tenderness, swelling, or minor bleeding of the gums may occur. Notify your dentist if this happens. Brushing and flossing your teeth regularly may help limit this. See your dentist regularly and inform your dentist of the medicines you are taking. If you are going to have elective surgery, you may need to stop taking this medicine before the surgery. Consult your health care professional for advice. This medicine does not  protect you against HIV infection (AIDS) or any other sexually transmitted diseases. What side effects may I notice from receiving this medicine? Side effects that you should report to your doctor or health care professional as soon as possible: -breast tissue changes or discharge -changes in vaginal bleeding during your period or between your periods -chest pain -coughing up blood -dizziness or fainting spells -headaches or migraines -leg, arm or groin pain -severe or sudden headaches -stomach pain (severe) -sudden shortness of breath -sudden loss of coordination, especially on one side of the body -speech problems -symptoms of vaginal infection like itching, irritation or unusual discharge -tenderness in the upper abdomen -vomiting -weakness or numbness in the arms or legs, especially on one side of the body -yellowing of the eyes or skin Side effects that usually do not require medical attention (report to your doctor or health care professional if they continue or are bothersome): -breakthrough bleeding and spotting that continues beyond the 3 initial cycles of pills -breast tenderness -mood changes, anxiety, depression, frustration, anger, or emotional outbursts -increased sensitivity to sun or ultraviolet light -nausea -skin rash, acne, or brown spots on the skin -weight gain (slight) This list may not describe all possible side effects. Call your doctor for medical advice about side effects. You may report side effects to FDA at 1-800-FDA-1088. Where should I keep my medicine? Keep out of the reach of children. Store at room temperature between 15 and 30 degrees C (59 and 86 degrees F). Throw away any unused medicine after the expiration date. NOTE: This sheet is a summary. It may not cover all possible information. If you have questions about this medicine, talk to your doctor, pharmacist, or health care provider.  2018 Elsevier/Gold Standard (2016-02-27 07:58:22)  

## 2017-09-12 NOTE — Progress Notes (Signed)
Encounter reviewed by Dr. Brook Amundson C. Silva.  

## 2017-09-23 ENCOUNTER — Ambulatory Visit (INDEPENDENT_AMBULATORY_CARE_PROVIDER_SITE_OTHER): Payer: 59 | Admitting: Psychology

## 2017-09-23 DIAGNOSIS — F411 Generalized anxiety disorder: Secondary | ICD-10-CM

## 2017-09-30 ENCOUNTER — Ambulatory Visit (INDEPENDENT_AMBULATORY_CARE_PROVIDER_SITE_OTHER): Payer: 59 | Admitting: Psychology

## 2017-09-30 ENCOUNTER — Other Ambulatory Visit: Payer: Self-pay | Admitting: Family Medicine

## 2017-09-30 DIAGNOSIS — F411 Generalized anxiety disorder: Secondary | ICD-10-CM

## 2017-09-30 MED ORDER — LISDEXAMFETAMINE DIMESYLATE 30 MG PO CAPS: 30.0000 mg | ORAL_CAPSULE | Freq: Every day | ORAL | 0 refills | Status: DC

## 2017-09-30 NOTE — Telephone Encounter (Signed)
Please advise 

## 2017-10-07 ENCOUNTER — Ambulatory Visit (INDEPENDENT_AMBULATORY_CARE_PROVIDER_SITE_OTHER): Payer: 59 | Admitting: Psychology

## 2017-10-07 DIAGNOSIS — F411 Generalized anxiety disorder: Secondary | ICD-10-CM

## 2017-10-14 ENCOUNTER — Ambulatory Visit (INDEPENDENT_AMBULATORY_CARE_PROVIDER_SITE_OTHER): Payer: 59 | Admitting: Psychology

## 2017-10-14 DIAGNOSIS — F411 Generalized anxiety disorder: Secondary | ICD-10-CM

## 2017-10-28 ENCOUNTER — Ambulatory Visit (INDEPENDENT_AMBULATORY_CARE_PROVIDER_SITE_OTHER): Payer: 59 | Admitting: Psychology

## 2017-10-28 DIAGNOSIS — F411 Generalized anxiety disorder: Secondary | ICD-10-CM | POA: Diagnosis not present

## 2017-11-03 ENCOUNTER — Other Ambulatory Visit: Payer: Self-pay | Admitting: Family Medicine

## 2017-11-04 MED ORDER — LISDEXAMFETAMINE DIMESYLATE 30 MG PO CAPS: 30.0000 mg | ORAL_CAPSULE | Freq: Every day | ORAL | 0 refills | Status: DC

## 2017-11-04 NOTE — Telephone Encounter (Signed)
Please advise 

## 2017-12-01 ENCOUNTER — Other Ambulatory Visit: Payer: Self-pay | Admitting: Family Medicine

## 2017-12-02 ENCOUNTER — Ambulatory Visit (INDEPENDENT_AMBULATORY_CARE_PROVIDER_SITE_OTHER): Payer: BLUE CROSS/BLUE SHIELD | Admitting: Psychology

## 2017-12-02 DIAGNOSIS — F411 Generalized anxiety disorder: Secondary | ICD-10-CM | POA: Diagnosis not present

## 2017-12-02 MED ORDER — LISDEXAMFETAMINE DIMESYLATE 30 MG PO CAPS: 30.0000 mg | ORAL_CAPSULE | Freq: Every day | ORAL | 0 refills | Status: DC

## 2017-12-02 NOTE — Telephone Encounter (Signed)
Please advise 

## 2017-12-06 ENCOUNTER — Encounter: Payer: Self-pay | Admitting: Family Medicine

## 2017-12-06 ENCOUNTER — Ambulatory Visit (INDEPENDENT_AMBULATORY_CARE_PROVIDER_SITE_OTHER): Payer: BLUE CROSS/BLUE SHIELD | Admitting: Family Medicine

## 2017-12-06 VITALS — BP 112/78 | HR 107 | Temp 98.3°F | Ht 65.0 in | Wt 155.0 lb

## 2017-12-06 DIAGNOSIS — R109 Unspecified abdominal pain: Secondary | ICD-10-CM

## 2017-12-06 DIAGNOSIS — K130 Diseases of lips: Secondary | ICD-10-CM | POA: Diagnosis not present

## 2017-12-06 LAB — POCT URINALYSIS DIPSTICK
BILIRUBIN UA: NEGATIVE
Blood, UA: NEGATIVE
GLUCOSE UA: NEGATIVE
Leukocytes, UA: NEGATIVE
NITRITE UA: POSITIVE
Protein, UA: NEGATIVE
Urobilinogen, UA: 1 E.U./dL
pH, UA: 6 (ref 5.0–8.0)

## 2017-12-06 MED ORDER — KETOCONAZOLE 2 % EX CREA: 1.0000 "application " | TOPICAL_CREAM | Freq: Every day | CUTANEOUS | 0 refills | Status: DC | PRN

## 2017-12-06 MED ORDER — DICLOFENAC SODIUM 75 MG PO TBEC: 75.0000 mg | DELAYED_RELEASE_TABLET | Freq: Two times a day (BID) | ORAL | 0 refills | Status: DC

## 2017-12-06 MED ORDER — CYCLOBENZAPRINE HCL 10 MG PO TABS: 10.0000 mg | ORAL_TABLET | Freq: Three times a day (TID) | ORAL | 0 refills | Status: DC | PRN

## 2017-12-06 NOTE — Progress Notes (Signed)
Subjective:  Felicia Ellison is a 22 y.o. female who presents today for same-day appointment with a chief complaint of flank pain.   HPI:  Flank Pain, acute problem Started a few days ago.  Located on her left flank.  Patient is also concerned because she is tolerating it well to palpation in her left lower back.  Patient recently moved out of her apartment and thinks that she may have strained a muscle.  Symptoms do not feel consistent with prior UTI or kidney infection.  No dysuria.  No fevers or chills.  No nausea or vomiting.  She has tried taking ibuprofen which is helped a little bit.  No other obvious alleviating or aggravating factors.  Mouth irritation, new problem Patient with intermittent history of mouth irritation.  She has had this in the past was given a dose of Diflucan which helped with her symptoms.  Recently had a flareup about a week ago.  She started applying Monistat which is helped some.  Symptoms have since resolved.  ROS: Per HPI  PMH: She reports that she quit smoking about 17 months ago. Her smoking use included cigarettes. She has never used smokeless tobacco. She reports that she drinks about 1.2 oz of alcohol per week. She reports that she does not use drugs.  Objective:  Physical Exam: BP 112/78 (BP Location: Left Arm, Patient Position: Sitting, Cuff Size: Normal)   Pulse (!) 107   Temp 98.3 F (36.8 C) (Oral)   Ht 5\' 5"  (1.651 m)   Wt 155 lb (70.3 kg)   SpO2 98%   BMI 25.79 kg/m   Gen: NAD, resting comfortably HEENT: Patient brought in picture of her mouth irritation.  Patient found to have erythematous rash involving upper and lower lip as well as angles of her mouth. CV: RRR with no murmurs appreciated Pulm: NWOB, CTAB with no crackles, wheezes, or rhonchi MSK: -Back: No deformities.  Mildly tender to palpation in left lower lumbar paraspinal muscle groups.  Palpable mass approximately 1 to 2 cm in diameter that is freely mobile and left lower  lumbar paraspinal muscle group as well. -Lower extremities: Strength 5 out of 5 throughout.  Patellar reflexes 2+ and symmetric bilaterally.  Results for orders placed or performed in visit on 12/06/17 (from the past 24 hour(s))  POCT Urinalysis Dipstick     Status: Abnormal   Collection Time: 12/06/17 11:01 AM  Result Value Ref Range   Color, UA Yellow    Clarity, UA Cloudy    Glucose, UA Negative Negative   Bilirubin, UA Negative    Ketones, UA Moderate(2+)    Spec Grav, UA >=1.030 (A) 1.010 - 1.025   Blood, UA Negative    pH, UA 6.0 5.0 - 8.0   Protein, UA Negative Negative   Urobilinogen, UA 1.0 0.2 or 1.0 E.U./dL   Nitrite, UA Positive    Leukocytes, UA Negative Negative   Appearance     Odor       Assessment/Plan:  Flank/back pain Likely musculoskeletal.  Her UA has nitrites, but she does not have any other signs or symptoms of a UTI.  We will await urine culture results before treating this.  We will treat her musculoskeletal pain with a course of diclofenac and Flexeril.  Reassured patient about her lump-this is likely due to tight connective tissue.  Discussed reasons to return to care.  Follow-up as needed.  Cheilitis  Given that symptoms seem to have improved with Monistat and prior episodes  have improved with Diflucan, this likely represents fungal infection.  We will send in prescription for ketoconazole cream to use as needed for flareups.  Discussed reasons to return to care.  Follow-up as needed.  [name], MD 12/06/2017 11:17 AM

## 2017-12-06 NOTE — Patient Instructions (Signed)
It was very nice to see you today!  I think you have a pulled muscle.  The lump in your back is due to muscle tightness.  Please start the diclofenac and Flexeril.  Please do not take any ibuprofen while you are on diclofenac.  I will send in a prescription for ketoconazole for your lip irritation.  Please use twice daily as needed.  We will call you with the urine culture results once they are available.  Please let me know if your symptoms worsen or do not improve over the next 7 to 10 days.  Take care, Dr Jimmey RalphParker

## 2017-12-06 NOTE — Assessment & Plan Note (Signed)
Given that symptoms seem to have improved with Monistat and prior episodes have improved with Diflucan, this likely represents fungal infection.  We will send in prescription for ketoconazole cream to use as needed for flareups.  Discussed reasons to return to care.  Follow-up as needed.

## 2017-12-08 LAB — URINE CULTURE
MICRO NUMBER: 90687034
SPECIMEN QUALITY:: ADEQUATE

## 2017-12-16 ENCOUNTER — Telehealth: Payer: Self-pay | Admitting: Obstetrics and Gynecology

## 2017-12-16 ENCOUNTER — Ambulatory Visit: Payer: Self-pay | Admitting: Obstetrics and Gynecology

## 2017-12-16 ENCOUNTER — Encounter: Payer: Self-pay | Admitting: Obstetrics and Gynecology

## 2017-12-16 NOTE — Telephone Encounter (Signed)
Thank you for the update.  Encounter closed. 

## 2017-12-16 NOTE — Telephone Encounter (Signed)
Called patient about missed recheck appointment. Offered later this afternoon but she can not make it and rescheduled to 01/01/18.

## 2017-12-16 NOTE — Progress Notes (Deleted)
GYNECOLOGY  VISIT   HPI: 22 y.o.   Single  Caucasian  female   G0P0 with No LMP recorded.   here for 3 month recheck    GYNECOLOGIC HISTORY: No LMP recorded. Contraception:  OCP Menopausal hormone therapy:  n/a Last mammogram:  n/a Last pap smear:   n/a        OB History    Gravida  0   Para      Term      Preterm      AB      Living        SAB      TAB      Ectopic      Multiple      Live Births                 Patient Active Problem List   Diagnosis Date Noted  . Cheilitis 12/06/2017  . 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 Dysmenorrhea 08/25/2017  . Dyspareunia in female 08/25/2017  . Irregular menses 08/25/2017  . Anxiety 05/17/2017  . ADHD 04/16/2017  . Migraines 04/16/2017  . Depression, major, single episode, moderate (HCC) 04/16/2017    Past Medical History:  Diagnosis Date  . Anxiety   . Arcuate uterus 2019   noted on pelvic ultrasound  . Depression   . Migraines    without aura    No past surgical history on file.  Current Outpatient Medications  Medication Sig Dispense Refill  . cyclobenzaprine (FLEXERIL) 10 MG tablet Take 1 tablet (10 mg total) by mouth 3 (three) times daily as needed for muscle spasms. 30 tablet 0  . diclofenac (VOLTAREN) 75 MG EC tablet Take 1 tablet (75 mg total) by mouth 2 (two) times daily. 30 tablet 0  . ketoconazole (NIZORAL) 2 % cream Apply 1 application topically daily as needed for irritation. 15 g 0  . Levonorgestrel-Ethinyl Estradiol (SEASONIQUE) 0.15-0.03 &0.01 MG tablet Take 1 tablet by mouth daily. (Patient not taking: Reported on 12/06/2017) 1 Package 1  . lisdexamfetamine (VYVANSE) 30 MG capsule Take 1 capsule (30 mg total) by mouth daily. 30 capsule 0   No current facility-administered medications for this visit.      ALLERGIES: Buspar [buspirone]  Family History  Problem Relation Age of Onset  . Hypothyroidism Mother   . Anxiety disorder Mother   . Anxiety disorder Father   . Cancer Maternal  Grandmother        Brain  . Hypertension Paternal Grandmother   . Heart attack Paternal Grandfather     Social History   Socioeconomic History  . Marital status: Single    Spouse name: Not on file  . Number of children: Not on file  . Years of education: Not on file  . Highest education level: Not on file  Occupational History  . Occupation: [employer]  Social Needs  . Financial resource strain: Not on file  . Food insecurity:    Worry: Not on file    Inability: Not on file  . Transportation needs:    Medical: Not on file    Non-medical: Not on file  Tobacco Use  . Smoking status: Former Smoker    Types: Cigarettes    Last attempt to quit: 07/02/2016    Years since quitting: 1.4  . Smokeless tobacco: Never Used  . Tobacco comment: VAPE  Substance and Sexual Activity  . Alcohol use: Yes    Alcohol/week: 1.2 oz    Types: 2 Glasses of wine  per week  . Drug use: No  . Sexual activity: Yes    Partners: Female    Birth control/protection: None    Comment: female partner  Lifestyle  . Physical activity:    Days per week: Not on file    Minutes per session: Not on file  . Stress: Not on file  Relationships  . Social connections:    Talks on phone: Not on file    Gets together: Not on file    Attends religious service: Not on file    Active member of club or organization: Not on file    Attends meetings of clubs or organizations: Not on file    Relationship status: Not on file  . Intimate partner violence:    Fear of current or ex partner: Not on file    Emotionally abused: Not on file    Physically abused: Not on file    Forced sexual activity: Not on file  Other Topics Concern  . Not on file  Social History Narrative  . Not on file    Review of Systems  PHYSICAL EXAMINATION:    There were no vitals taken for this visit.    General appearance: alert, cooperative and appears stated age Head: Normocephalic, without obvious abnormality, atraumatic Neck: no  adenopathy, supple, symmetrical, trachea midline and thyroid normal to inspection and palpation Lungs: clear to auscultation bilaterally Breasts: normal appearance, no masses or tenderness, No nipple retraction or dimpling, No nipple discharge or bleeding, No axillary or supraclavicular adenopathy Heart: regular rate and rhythm Abdomen: soft, non-tender, no masses,  no organomegaly Extremities: extremities normal, atraumatic, no cyanosis or edema Skin: Skin color, texture, turgor normal. No rashes or lesions Lymph nodes: Cervical, supraclavicular, and axillary nodes normal. No abnormal inguinal nodes palpated Neurologic: Grossly normal  Pelvic: External genitalia:  no lesions              Urethra:  normal appearing urethra with no masses, tenderness or lesions              Bartholins and Skenes: normal                 Vagina: normal appearing vagina with normal color and discharge, no lesions              Cervix: no lesions                Bimanual Exam:  Uterus:  normal size, contour, position, consistency, mobility, non-tender              Adnexa: no mass, fullness, tenderness              Rectal exam: {yes no:[id number]}.  Confirms.              Anus:  normal sphincter tone, no lesions  Chaperone was present for exam.  ASSESSMENT     PLAN     An After Visit Summary was printed and given to the patient.  ______ minutes face to face time of which over 50% was spent in counseling.

## 2017-12-23 ENCOUNTER — Ambulatory Visit (INDEPENDENT_AMBULATORY_CARE_PROVIDER_SITE_OTHER): Payer: BLUE CROSS/BLUE SHIELD | Admitting: Psychology

## 2017-12-23 DIAGNOSIS — F411 Generalized anxiety disorder: Secondary | ICD-10-CM

## 2017-12-30 ENCOUNTER — Other Ambulatory Visit: Payer: Self-pay | Admitting: Family Medicine

## 2017-12-30 MED ORDER — LISDEXAMFETAMINE DIMESYLATE 30 MG PO CAPS: 30.0000 mg | ORAL_CAPSULE | Freq: Every day | ORAL | 0 refills | Status: DC

## 2017-12-30 NOTE — Telephone Encounter (Signed)
Please advise 

## 2018-01-01 ENCOUNTER — Encounter: Payer: Self-pay | Admitting: Obstetrics and Gynecology

## 2018-01-01 ENCOUNTER — Ambulatory Visit (INDEPENDENT_AMBULATORY_CARE_PROVIDER_SITE_OTHER): Payer: BLUE CROSS/BLUE SHIELD | Admitting: Obstetrics and Gynecology

## 2018-01-01 ENCOUNTER — Other Ambulatory Visit: Payer: Self-pay

## 2018-01-01 VITALS — BP 122/70 | HR 74 | Resp 16 | Ht 65.0 in | Wt 149.0 lb

## 2018-01-01 DIAGNOSIS — N941 Unspecified dyspareunia: Secondary | ICD-10-CM | POA: Diagnosis not present

## 2018-01-01 DIAGNOSIS — N898 Other specified noninflammatory disorders of vagina: Secondary | ICD-10-CM

## 2018-01-01 DIAGNOSIS — N946 Dysmenorrhea, unspecified: Secondary | ICD-10-CM

## 2018-01-01 MED ORDER — ETONOGESTREL-ETHINYL ESTRADIOL 0.12-0.015 MG/24HR VA RING: 1.0000 | VAGINAL_RING | VAGINAL | 3 refills | Status: DC

## 2018-01-01 NOTE — Progress Notes (Signed)
GYNECOLOGY  VISIT   HPI: 22 y.o.   Single  Caucasian  female   G0P0 with Patient's last menstrual period was 12/18/2017 (approximate).   here for   Recheck.  Painful intercourse and painful periods.   Pelvic US on 09/12/17 which was normal other than arcuate uterus.  Normal ovaries.   Took Seasonique for 1 - 1.5 months.  Had emotional changes so she stopped.  Some vaginal odor and discharge. No itching.   Does not have sex with men.   GYNECOLOGIC HISTORY: Patient's last menstrual period was 12/18/2017 (approximate). Contraception:  None Menopausal hormone therapy:  NA Last mammogram:  NA Last pap smear:   08/23/17        OB History    Gravida  0   Para      Term      Preterm      AB      Living        SAB      TAB      Ectopic      Multiple      Live Births                 Patient Active Problem List   Diagnosis Date Noted  . Cheilitis 12/06/2017  . 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 Dysmenorrhea 08/25/2017  . Dyspareunia in female 08/25/2017  . Irregular menses 08/25/2017  . Anxiety 05/17/2017  . ADHD 04/16/2017  . Migraines 04/16/2017  . Depression, major, single episode, moderate (HCC) 04/16/2017    Past Medical History:  Diagnosis Date  . Anxiety   . Arcuate uterus 2019   noted on pelvic ultrasound  . Depression   . Migraines    without aura    No past surgical history on file.  Current Outpatient Medications  Medication Sig Dispense Refill  . lisdexamfetamine (VYVANSE) 30 MG capsule Take 1 capsule (30 mg total) by mouth daily. 30 capsule 0  . cyclobenzaprine (FLEXERIL) 10 MG tablet Take 1 tablet (10 mg total) by mouth 3 (three) times daily as needed for muscle spasms. (Patient not taking: Reported on 01/01/2018) 30 tablet 0  . diclofenac (VOLTAREN) 75 MG EC tablet Take 1 tablet (75 mg total) by mouth 2 (two) times daily. (Patient not taking: Reported on 01/01/2018) 30 tablet 0  . ketoconazole (NIZORAL) 2 % cream Apply 1 application  topically daily as needed for irritation. (Patient not taking: Reported on 01/01/2018) 15 g 0  . Levonorgestrel-Ethinyl Estradiol (SEASONIQUE) 0.15-0.03 &0.01 MG tablet Take 1 tablet by mouth daily. (Patient not taking: Reported on 12/06/2017) 1 Package 1   No current facility-administered medications for this visit.      ALLERGIES: Buspar [buspirone]  Family History  Problem Relation Age of Onset  . Hypothyroidism Mother   . Anxiety disorder Mother   . Anxiety disorder Father   . Cancer Maternal Grandmother        Brain  . Hypertension Paternal Grandmother   . Heart attack Paternal Grandfather     Social History   Socioeconomic History  . Marital status: Single    Spouse name: Not on file  . Number of children: Not on file  . Years of education: Not on file  . Highest education level: Not on file  Occupational History  . Occupation: [employer]  Social Needs  . Financial resource strain: Not on file  . Food insecurity:    Worry: Not on file    Inability: Not on file  . Transportation  needs:    Medical: Not on file    Non-medical: Not on file  Tobacco Use  . Smoking status: Former Smoker    Types: Cigarettes    Last attempt to quit: 07/02/2016    Years since quitting: 1.5  . Smokeless tobacco: Never Used  . Tobacco comment: VAPE  Substance and Sexual Activity  . Alcohol use: Yes    Alcohol/week: 1.2 oz    Types: 2 Glasses of wine per week  . Drug use: No  . Sexual activity: Yes    Partners: Female    Birth control/protection: None    Comment: female partner  Lifestyle  . Physical activity:    Days per week: Not on file    Minutes per session: Not on file  . Stress: Not on file  Relationships  . Social connections:    Talks on phone: Not on file    Gets together: Not on file    Attends religious service: Not on file    Active member of club or organization: Not on file    Attends meetings of clubs or organizations: Not on file    Relationship status: Not on file   . Intimate partner violence:    Fear of current or ex partner: Not on file    Emotionally abused: Not on file    Physically abused: Not on file    Forced sexual activity: Not on file  Other Topics Concern  . Not on file  Social History Narrative  . Not on file    Review of Systems  PHYSICAL EXAMINATION:    BP 122/70   Pulse 74   Resp 16   Ht 5\' 5"  (1.651 m)   Wt 149 lb (67.6 kg)   LMP 12/18/2017 (Approximate)   BMI 24.79 kg/m     General appearance: alert, cooperative and appears stated age   Pelvic: External genitalia:  no lesions              Urethra:  normal appearing urethra with no masses, tenderness or lesions              Bartholins and Skenes: normal                 Vagina: normal appearing vagina with normal color and discharge, no lesions              Cervix: no lesions                Bimanual Exam:  Uterus:  normal size, contour, position, consistency, mobility, non-tender              Adnexa: no mass, fullness, tenderness          Chaperone was present for exam.  ASSESSMENT  Vaginal discharge.  Dyspareunia.  Dysmenorrhea. Arcuate uterus with good uterine length.  PLAN  We discussed pelvic pain further and options for care.  She wants something she can control so a long acting hormonal method is not appealing to her.  NuvaRing x 4 months.  Instructed in use.  Affirm.    An After Visit Summary was printed and given to the patient.  _25_____ minutes face to face time of which over 50% was spent in counseling.

## 2018-01-01 NOTE — Patient Instructions (Addendum)
Ethinyl Estradiol; Etonogestrel vaginal ring What is this medicine? ETHINYL ESTRADIOL; ETONOGESTREL (ETH in il es tra DYE ole; et oh noe JES trel) vaginal ring is a flexible, vaginal ring used as a contraceptive (birth control method). This medicine combines two types of female hormones, an estrogen and a progestin. This ring is used to prevent ovulation and pregnancy. Each ring is effective for one month. This medicine may be used for other purposes; ask your health care provider or pharmacist if you have questions. COMMON BRAND NAME(S): NuvaRing What should I tell my health care provider before I take this medicine? They need to know if you have or ever had any of these conditions: -abnormal vaginal bleeding -blood vessel disease or blood clots -breast, cervical, endometrial, ovarian, liver, or uterine cancer -diabetes -gallbladder disease -heart disease or recent heart attack -high blood pressure -high cholesterol -kidney disease -liver disease -migraine headaches -stroke -systemic lupus erythematosus (SLE) -tobacco smoker -an unusual or allergic reaction to estrogens, progestins, other medicines, foods, dyes, or preservatives -pregnant or trying to get pregnant -breast-feeding How should I use this medicine? Insert the ring into your vagina as directed. Follow the directions on the prescription label. The ring will remain place for 3 weeks and is then removed for a 1-week break. A new ring is inserted 1 week after the last ring was removed, on the same day of the week. Check often to make sure the ring is still in place, especially before and after sexual intercourse. If the ring was out of the vagina for an unknown amount of time, you may not be protected from pregnancy. Perform a pregnancy test and call your doctor. Do not use more often than directed. A patient package insert for the product will be given with each prescription and refill. Read this sheet carefully each time. The  sheet may change frequently. Contact your pediatrician regarding the use of this medicine in children. Special care may be needed. This medicine has been used in female children who have started having menstrual periods. Overdosage: If you think you have taken too much of this medicine contact a poison control center or emergency room at once. NOTE: This medicine is only for you. Do not share this medicine with others. What if I miss a dose? You will need to replace your vaginal ring once a month as directed. If the ring should slip out, or if you leave it in longer or shorter than you should, contact your health care professional for advice. What may interact with this medicine? Do not take this medicine with the following medication: -dasabuvir; ombitasvir; paritaprevir; ritonavir -ombitasvir; paritaprevir; ritonavir This medicine may also interact with the following medications: -acetaminophen -antibiotics or medicines for infections, especially rifampin, rifabutin, rifapentine, and griseofulvin, and possibly penicillins or tetracyclines -aprepitant -ascorbic acid (vitamin C) -atorvastatin -barbiturate medicines, such as phenobarbital -bosentan -carbamazepine -caffeine -clofibrate -cyclosporine -dantrolene -doxercalciferol -felbamate -grapefruit juice -hydrocortisone -medicines for anxiety or sleeping problems, such as diazepam or temazepam -medicines for diabetes, including pioglitazone -modafinil -mycophenolate -nefazodone -oxcarbazepine -phenytoin -prednisolone -ritonavir or other medicines for HIV infection or AIDS -rosuvastatin -selegiline -soy isoflavones supplements -St. John's wort -tamoxifen or raloxifene -theophylline -thyroid hormones -topiramate -warfarin This list may not describe all possible interactions. Give your health care provider a list of all the medicines, herbs, non-prescription drugs, or dietary supplements you use. Also tell them if you smoke,  drink alcohol, or use illegal drugs. Some items may interact with your medicine. What should I watch for while using   this medicine? Visit your doctor or health care professional for regular checks on your progress. You will need a regular breast and pelvic exam and Pap smear while on this medicine. Use an additional method of contraception during the first cycle that you use this ring. Do not use a diaphragm or female condom, as the ring can interfere with these birth control methods and their proper placement. If you have any reason to think you are pregnant, stop using this medicine right away and contact your doctor or health care professional. If you are using this medicine for hormone related problems, it may take several cycles of use to see improvement in your condition. Smoking increases the risk of getting a blood clot or having a stroke while you are using hormonal birth control, especially if you are more than 20 years old. You are strongly advised not to smoke. This medicine can make your body retain fluid, making your fingers, hands, or ankles swell. Your blood pressure can go up. Contact your doctor or health care professional if you feel you are retaining fluid. This medicine can make you more sensitive to the sun. Keep out of the sun. If you cannot avoid being in the sun, wear protective clothing and use sunscreen. Do not use sun lamps or tanning beds/booths. If you wear contact lenses and notice visual changes, or if the lenses begin to feel uncomfortable, consult your eye care specialist. In some women, tenderness, swelling, or minor bleeding of the gums may occur. Notify your dentist if this happens. Brushing and flossing your teeth regularly may help limit this. See your dentist regularly and inform your dentist of the medicines you are taking. If you are going to have elective surgery, you may need to stop using this medicine before the surgery. Consult your health care professional  for advice. This medicine does not protect you against HIV infection (AIDS) or any other sexually transmitted diseases. What side effects may I notice from receiving this medicine? Side effects that you should report to your doctor or health care professional as soon as possible: -breast tissue changes or discharge -changes in vaginal bleeding during your period or between your periods -chest pain -coughing up blood -dizziness or fainting spells -headaches or migraines -leg, arm or groin pain -severe or sudden headaches -stomach pain (severe) -sudden shortness of breath -sudden loss of coordination, especially on one side of the body -speech problems -symptoms of vaginal infection like itching, irritation or unusual discharge -tenderness in the upper abdomen -vomiting -weakness or numbness in the arms or legs, especially on one side of the body -yellowing of the eyes or skin Side effects that usually do not require medical attention (report to your doctor or health care professional if they continue or are bothersome): -breakthrough bleeding and spotting that continues beyond the 3 initial cycles of pills -breast tenderness -mood changes, anxiety, depression, frustration, anger, or emotional outbursts -increased sensitivity to sun or ultraviolet light -nausea -skin rash, acne, or brown spots on the skin -weight gain (slight) This list may not describe all possible side effects. Call your doctor for medical advice about side effects. You may report side effects to FDA at 1-800-FDA-1088. Where should I keep my medicine? Keep out of the reach of children. Store at room temperature between 15 and 30 degrees C (59 and 86 degrees F) for up to 4 months. The product will expire after 4 months. Protect from light. Throw away any unused medicine after the expiration date. NOTE: This   sheet is a summary. It may not cover all possible information. If you have questions about this medicine, talk  to your doctor, pharmacist, or health care provider.  2018 Elsevier/Gold Standard (2016-02-24 17:00:31)   Vaginitis Vaginitis is a condition in which the vaginal tissue swells and becomes red (inflamed). This condition is most often caused by a change in the normal balance of bacteria and yeast that live in the vagina. This change causes an overgrowth of certain bacteria or yeast, which causes the inflammation. There are different types of vaginitis, but the most common types are:  Bacterial vaginosis.  Yeast infection (candidiasis).  Trichomoniasis vaginitis. This is a sexually transmitted disease (STD).  Viral vaginitis.  Atrophic vaginitis.  Allergic vaginitis.  What are the causes? The cause of this condition depends on the type of vaginitis. It can be caused by:  Bacteria (bacterial vaginosis).  Yeast, which is a fungus (yeast infection).  A parasite (trichomoniasis vaginitis).  A virus (viral vaginitis).  Low hormone levels (atrophic vaginitis). Low hormone levels can occur during pregnancy, breastfeeding, or after menopause.  Irritants, such as bubble baths, scented tampons, and feminine sprays (allergic vaginitis).  Other factors can change the normal balance of the yeast and bacteria that live in the vagina. These include:  Antibiotic medicines.  Poor hygiene.  Diaphragms, vaginal sponges, spermicides, birth control pills, and intrauterine devices (IUD).  Sex.  Infection.  Uncontrolled diabetes.  A weakened defense (immune) system.  What increases the risk? This condition is more likely to develop in women who:  Smoke.  Use vaginal douches, scented tampons, or scented sanitary pads.  Wear tight-fitting pants.  Wear thong underwear.  Use oral birth control pills or an IUD.  Have sex without a condom.  Have multiple sex partners.  Have an STD.  Frequently use the spermicide nonoxynol-9.  Eat lots of foods high in sugar.  Have  uncontrolled diabetes.  Have low estrogen levels.  Have a weakened immune system from an immune disorder or medical treatment.  Are pregnant or breastfeeding.  What are the signs or symptoms? Symptoms vary depending on the cause of the vaginitis. Common symptoms include:  Abnormal vaginal discharge. ? The discharge is white, gray, or yellow with bacterial vaginosis. ? The discharge is thick, white, and cheesy with a yeast infection. ? The discharge is frothy and yellow or greenish with trichomoniasis.  A bad vaginal smell. The smell is fishy with bacterial vaginosis.  Vaginal itching, pain, or swelling.  Sex that is painful.  Pain or burning when urinating.  Sometimes there are no symptoms. How is this diagnosed? This condition is diagnosed based on your symptoms and medical history. A physical exam, including a pelvic exam, will also be done. You may also have other tests, including:  Tests to determine the pH level (acidity or alkalinity) of your vagina.  A whiff test, to assess the odor that results when a sample of your vaginal discharge is mixed with a potassium hydroxide solution.  Tests of vaginal fluid. A sample will be examined under a microscope.  How is this treated? Treatment varies depending on the type of vaginitis you have. Your treatment may include:  Antibiotic creams or pills to treat bacterial vaginosis and trichomoniasis.  Antifungal medicines, such as vaginal creams or suppositories, to treat a yeast infection.  Medicine to ease discomfort if you have viral vaginitis. Your sexual partner should also be treated.  Estrogen delivered in a cream, pill, suppository, or vaginal ring to treat atrophic  vaginitis. If vaginal dryness occurs, lubricants and moisturizing creams may help. You may need to avoid scented soaps, sprays, or douches.  Stopping use of a product that is causing allergic vaginitis. Then using a vaginal cream to treat the  symptoms.  Follow these instructions at home: Lifestyle  Keep your genital area clean and dry. Avoid soap, and only rinse the area with water.  Do not douche or use tampons until your health care provider says it is okay to do so. Use sanitary pads, if needed.  Do not have sex until your health care provider approves. When you can return to sex, practice safe sex and use condoms.  Wipe from front to back. This avoids the spread of bacteria from the rectum to the vagina. General instructions  Take over-the-counter and prescription medicines only as told by your health care provider.  If you were prescribed an antibiotic medicine, take or use it as told by your health care provider. Do not stop taking or using the antibiotic even if you start to feel better.  Keep all follow-up visits as told by your health care provider. This is important. How is this prevented?  Use mild, non-scented products. Do not use things that can irritate the vagina, such as fabric softeners. Avoid the following products if they are scented: ? Feminine sprays. ? Detergents. ? Tampons. ? Feminine hygiene products. ? Soaps or bubble baths.  Let air reach your genital area. ? Wear cotton underwear to reduce moisture buildup. ? Avoid wearing underwear while you sleep. ? Avoid wearing tight pants and underwear or nylons without a cotton panel. ? Avoid wearing thong underwear.  Take off any wet clothing, such as bathing suits, as soon as possible.  Practice safe sex and use condoms. Contact a health care provider if:  You have abdominal pain.  You have a fever.  You have symptoms that last for more than 2-3 days. Get help right away if:  You have a fever and your symptoms suddenly get worse. Summary  Vaginitis is a condition in which the vaginal tissue becomes inflamed.This condition is most often caused by a change in the normal balance of bacteria and yeast that live in the vagina.  Treatment  varies depending on the type of vaginitis you have.  Do not douche, use tampons , or have sex until your health care provider approves. When you can return to sex, practice safe sex and use condoms. This information is not intended to replace advice given to you by your health care provider. Make sure you discuss any questions you have with your health care provider. Document Released: 04/15/2007 Document Revised: 07/24/2016 Document Reviewed: 07/24/2016 Elsevier Interactive Patient Education  [employer]2018 Elsevier Inc.

## 2018-01-04 LAB — VAGINITIS/VAGINOSIS, DNA PROBE
Candida Species: NEGATIVE
Gardnerella vaginalis: POSITIVE — AB
TRICHOMONAS VAG: NEGATIVE

## 2018-01-06 ENCOUNTER — Ambulatory Visit (INDEPENDENT_AMBULATORY_CARE_PROVIDER_SITE_OTHER): Payer: BLUE CROSS/BLUE SHIELD | Admitting: Psychology

## 2018-01-06 DIAGNOSIS — F411 Generalized anxiety disorder: Secondary | ICD-10-CM

## 2018-01-07 ENCOUNTER — Telehealth: Payer: Self-pay | Admitting: *Deleted

## 2018-01-07 NOTE — Telephone Encounter (Signed)
Notes recorded by Leda MinHamm, Khyleigh Furney N, RN on 01/07/2018 at 8:09 AM EDT Mailbox full, unable to leave message.

## 2018-01-07 NOTE — Telephone Encounter (Signed)
-----   Message from Patton SallesBrook E Amundson C Silva, MD sent at 01/06/2018  5:33 PM EDT ----- Please inform of Affirm result showing bacterial vaginosis. She may treat with Flagyl 500 mg po bid for 7 days or Metrogel pv at hs for 5 nights.  Please send Rx to pharmacy of choice. ETOH precautions.

## 2018-01-09 MED ORDER — METRONIDAZOLE 500 MG PO TABS: 500.0000 mg | ORAL_TABLET | Freq: Two times a day (BID) | ORAL | 0 refills | Status: DC

## 2018-01-09 NOTE — Telephone Encounter (Signed)
Spoke with patient, advised as seen below per Dr. Silva.  Patient verbalizes understanding and is agreeable.  Encounter closed.  

## 2018-01-09 NOTE — Telephone Encounter (Signed)
No answer, mailbox full, unable to leave message.

## 2018-01-22 ENCOUNTER — Ambulatory Visit (HOSPITAL_COMMUNITY)
Admission: RE | Admit: 2018-01-22 | Discharge: 2018-01-22 | Disposition: A | Discharge status: Home / Self Care | Payer: BLUE CROSS/BLUE SHIELD | Attending: Psychiatry | Admitting: Psychiatry

## 2018-01-22 DIAGNOSIS — Z1330 Encounter for screening examination for mental health and behavioral disorders, unspecified: Secondary | ICD-10-CM | POA: Diagnosis present
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
## 2018-01-22 NOTE — H&P (Signed)
Behavioral Health Medical Screening Exam  Felicia Ellison is an 22 y.o. female.  Total Time spent with patient: 20 minutes  Psychiatric Specialty Exam: Physical Exam  Constitutional: She is oriented to person, place, and time. She appears well-developed and well-nourished. No distress.  HENT:  Head: Normocephalic.  Right Ear: External ear normal.  Left Ear: External ear normal.  Eyes: Conjunctivae are normal. Right eye exhibits no discharge. Left eye exhibits no discharge. No scleral icterus.  Respiratory: Effort normal. No respiratory distress.  Musculoskeletal: Normal range of motion.  Neurological: She is alert and oriented to person, place, and time.  Skin: Skin is warm and dry. She is not diaphoretic.  Psychiatric: Her speech is normal and behavior is normal. Her mood appears anxious. Thought content is not paranoid and not delusional. Cognition and memory are normal. She expresses impulsivity and inappropriate judgment. She exhibits a depressed mood. She expresses no homicidal and no suicidal ideation.    Review of Systems  Constitutional: Negative for chills, fever and weight loss.  Psychiatric/Behavioral: Positive for depression. Negative for hallucinations, memory loss, substance abuse and suicidal ideas. The patient is nervous/anxious. The patient does not have insomnia.   All other systems reviewed and are negative.     General Appearance: Casual, Neat and Well Groomed  Eye Contact:  Good  Speech:  Clear and Coherent and Normal Rate  Volume:  Normal  Mood:  Depressed  Affect:  Congruent and Depressed  Thought Process:  Coherent, Goal Directed and Descriptions of Associations: Intact  Orientation:  Full (Time, Place, and Person)  Thought Content:  Logical and Hallucinations: None  Suicidal Thoughts:  No  Homicidal Thoughts:  No  Memory:  Immediate;   Fair Recent;   Fair  Judgement:  Good  Insight:  Good  Psychomotor Activity:  Normal  Concentration: Concentration:  Good and Attention Span: Good  Recall:  Good  Fund of Knowledge:Good  Language: Good  Akathisia:  No  Handed:  Right  AIMS (if indicated):     Assets:  Communication Skills Desire for Improvement Financial Resources/Insurance Housing Intimacy Leisure Time Physical Health Transportation  Sleep:       Musculoskeletal: Strength & Muscle Tone: within normal limits Gait & Station: normal   Recommendations:  Based on my evaluation the patient does not appear to have an emergency medical condition.  No evidence of imminent risk to self or others at present.   Patient does not meet criteria for psychiatric inpatient admission. Supportive therapy provided about ongoing stressors. Discussed crisis plan, support from social network, calling 911, coming to the Emergency Department, and calling Suicide Hotline. Follow-up with current therapist. Patient will contact [facility].   Jackelyn PolingJason A Berry, NP 01/22/2018, 10:14 PM

## 2018-01-27 ENCOUNTER — Ambulatory Visit: Payer: 59 | Admitting: Psychology

## 2018-01-30 ENCOUNTER — Other Ambulatory Visit: Payer: Self-pay | Admitting: Family Medicine

## 2018-01-30 MED ORDER — LISDEXAMFETAMINE DIMESYLATE 30 MG PO CAPS: 30.0000 mg | ORAL_CAPSULE | Freq: Every day | ORAL | 0 refills | Status: DC

## 2018-01-30 NOTE — Telephone Encounter (Signed)
See note

## 2018-01-30 NOTE — Telephone Encounter (Signed)
Copied from CRM [id number]. Topic: Quick Communication - Rx Refill/Question >> Jan 30, 2018  3:42 PM Felicia Ellison, Felicia Ellison, Felicia Ellison wrote:   **Patient states that she used the med refill option on mychart 2 days ago and has now ran out of medication. ** Medication: lisdexamfetamine (VYVANSE) 30 MG capsule  Has the patient contacted their pharmacy? Yes.   (Agent: If no, request that the patient contact the pharmacy for the refill.) (Agent: If yes, when and what did the pharmacy advise?)  Preferred Pharmacy (with phone number or street name): [id number] [place], [place] - [address]  Agent: Please be advised that RX refills may take up to 3 business days. We ask that you follow-up with your pharmacy.

## 2018-01-30 NOTE — Telephone Encounter (Signed)
Patient is requesting refill for vyvanse asap.

## 2018-01-30 NOTE — Telephone Encounter (Signed)
Patient has received printed prescription.

## 2018-01-30 NOTE — Telephone Encounter (Signed)
Vyvanse refill Last Refill:12/30/17 #30 Last OV: 08/13/17 PCP: Dr. Jimmey RalphParker [facility] [address]

## 2018-02-21 ENCOUNTER — Ambulatory Visit: Payer: Self-pay | Admitting: Psychology

## 2018-02-23 ENCOUNTER — Telehealth: Payer: Self-pay | Admitting: Family Medicine

## 2018-02-24 MED ORDER — LISDEXAMFETAMINE DIMESYLATE 30 MG PO CAPS: 30.0000 mg | ORAL_CAPSULE | Freq: Every day | ORAL | 0 refills | Status: DC

## 2018-02-24 NOTE — Telephone Encounter (Signed)
Please advise 

## 2018-02-24 NOTE — Telephone Encounter (Signed)
Rx sent in but she will have to wait until 30 days after her last fill to pick up.  [name], MD 02/24/2018 8:48 AM

## 2018-02-28 ENCOUNTER — Telehealth: Payer: Self-pay | Admitting: Family Medicine

## 2018-02-28 NOTE — Telephone Encounter (Signed)
Called patient - no answer no vm  ?

## 2018-02-28 NOTE — Telephone Encounter (Signed)
Pt called back. °

## 2018-02-28 NOTE — Telephone Encounter (Signed)
Copied from CRM [id number]. Topic: General - Other >> Feb 28, 2018 12:57 PM Mcneil, Ja-Kwan wrote: Reason for CRM: Pt upset that her Rx for lisdexamfetamine (VYVANSE) 30 MG capsule can not be filled until 03/01/18. Pt states since it can not be filled she will just buy it off the streets.

## 2018-02-28 NOTE — Telephone Encounter (Signed)
See note

## 2018-02-28 NOTE — Telephone Encounter (Signed)
Dr. Jimmey RalphParker-  Please see message.  Thanks

## 2018-02-28 NOTE — Telephone Encounter (Signed)
Notified patient that prescription would be ready tomorrow.

## 2018-02-28 NOTE — Telephone Encounter (Signed)
Pt is calling to get the hold off of her vyvanse rx that was sent in to the pharmacy back  On 02/24/18 and was labelled to fill on 03/01/18 and pt is completely out and needs it TODAY

## 2018-03-07 ENCOUNTER — Ambulatory Visit: Payer: BLUE CROSS/BLUE SHIELD | Admitting: Psychology

## 2018-03-18 NOTE — Telephone Encounter (Signed)
Hi Lea,  Can you contact patient about inappropriate behavior (stating that she would buy her meds off the street since it could not be filled a day early). This type of behavior is not acceptable and if it continues, we may need to dismiss her from the practice.   [name], MD 03/18/2018 12:25 PM

## 2018-03-26 ENCOUNTER — Other Ambulatory Visit: Payer: Self-pay | Admitting: Obstetrics and Gynecology

## 2018-03-26 ENCOUNTER — Other Ambulatory Visit: Payer: Self-pay | Admitting: Family Medicine

## 2018-03-26 NOTE — Telephone Encounter (Signed)
I will refill until annual exam is due.  Please let patient know she needs to schedule for end of Feb/begininng March 2020.

## 2018-03-26 NOTE — Telephone Encounter (Signed)
Medication refill request: nuvaring  Last AEX:  OV 01/01/18  Next AEX: none Last MMG (if hormonal medication request): none Refill authorized: 01/01/18 #1/3R.   Requesting 90 day supply. Please advise.

## 2018-03-26 NOTE — Telephone Encounter (Signed)
Please advise 

## 2018-03-26 NOTE — Telephone Encounter (Signed)
AEX scheduled 08/27/18@10 :00am.

## 2018-03-31 ENCOUNTER — Other Ambulatory Visit: Payer: Self-pay

## 2018-03-31 MED ORDER — LISDEXAMFETAMINE DIMESYLATE 30 MG PO CAPS: 30.0000 mg | ORAL_CAPSULE | Freq: Every day | ORAL | 0 refills | Status: DC

## 2018-03-31 NOTE — Telephone Encounter (Signed)
Spoke with patient and informed her that Dr. Jimmey Ralph has been out of the office and will not be back until 04/01/2018.  Offered printed prescription and patient was satisfied with that and agreed to come and pick it up this afternoon.  Patient insisted she could not wait until tomorrow to get her medication refilled.

## 2018-03-31 NOTE — Telephone Encounter (Signed)
Pt called back today and said that she has not got her medication send to her pharmacy and needs it send by today. Patient would like a call back when med has been sent please.

## 2018-04-01 ENCOUNTER — Telehealth: Payer: Self-pay

## 2018-04-01 NOTE — Telephone Encounter (Signed)
No, I have called and was unable to reach her. Let me try again today.

## 2018-04-01 NOTE — Telephone Encounter (Signed)
PA for Vyvanse approved through 04/02/2019.  Patient's pharmacy notified.

## 2018-04-01 NOTE — Telephone Encounter (Signed)
PA for Vyvanse is in progress.  Waiting for insurance decision. 

## 2018-04-01 NOTE — Telephone Encounter (Signed)
Hi Lea,  Were you able to talk to this patient about inappropriate behavior regarding her last refill?  Katina Degree. Jimmey Ralph, MD 04/01/2018 8:53 AM

## 2018-04-14 ENCOUNTER — Encounter: Payer: Self-pay | Admitting: Family Medicine

## 2018-04-14 ENCOUNTER — Ambulatory Visit (INDEPENDENT_AMBULATORY_CARE_PROVIDER_SITE_OTHER): Payer: Managed Care, Other (non HMO) | Admitting: Family Medicine

## 2018-04-14 VITALS — BP 118/72 | HR 88 | Temp 98.6°F | Ht 65.0 in | Wt 149.0 lb

## 2018-04-14 DIAGNOSIS — F909 Attention-deficit hyperactivity disorder, unspecified type: Secondary | ICD-10-CM

## 2018-04-14 DIAGNOSIS — F419 Anxiety disorder, unspecified: Secondary | ICD-10-CM | POA: Diagnosis not present

## 2018-04-14 DIAGNOSIS — F321 Major depressive disorder, single episode, moderate: Secondary | ICD-10-CM | POA: Diagnosis not present

## 2018-04-14 DIAGNOSIS — F319 Bipolar disorder, unspecified: Secondary | ICD-10-CM | POA: Diagnosis not present

## 2018-04-14 MED ORDER — GABAPENTIN 100 MG PO CAPS: 100.0000 mg | ORAL_CAPSULE | Freq: Three times a day (TID) | ORAL | 2 refills | Status: DC

## 2018-04-14 MED ORDER — ARIPIPRAZOLE 5 MG PO TABS: 5.0000 mg | ORAL_TABLET | Freq: Every day | ORAL | 11 refills | Status: DC

## 2018-04-14 MED ORDER — OXCARBAZEPINE 300 MG PO TABS: 300.0000 mg | ORAL_TABLET | Freq: Two times a day (BID) | ORAL | 2 refills | Status: DC

## 2018-04-14 NOTE — Assessment & Plan Note (Addendum)
Stable.  Continue Vyvanse 30 mg daily. Follow up in 6 months.

## 2018-04-14 NOTE — Assessment & Plan Note (Signed)
See above.  Referral to psychiatry is pending.  Continue gabapentin 100 mg 3 times daily and Trileptal 300 mg twice daily.

## 2018-04-14 NOTE — Assessment & Plan Note (Signed)
See above.  ContinueTrileptal 300 mg twice daily and gabapentin 100 mg 3 times daily.

## 2018-04-14 NOTE — Progress Notes (Signed)
   Subjective:  Felicia Ellison is a 22 y.o. female who presents today with a chief complaint of bipolar disorder.   HPI:  Bipolar Disorder, chronic problem, new to provider Patient previously seeing a psychiatrist in the area for this, however her provider reportedly left the practice and she is currently trying to find a new provider.  She is currently on gabapentin 100 mg 3 times daily and Trileptal 300 mg twice daily.  These medications help with her manic symptoms however still has quite a bit of lows and depression.  She is actively trying to find a new psychiatrist.  She has been on Zoloft and BuSpar in the past, which she did not tolerate due to side effects.  No current SI or HI.  Mood is stable per her report.    Depression, chronic problem, stable She is not currently on any antidepressant medications.  She has had a few lows recently but overall her mood is stable.  No SI or HI as noted above.  She is interested in starting another depression medication today.  ADHD, chronic problem, stable Currently on vyvanse 30mg  daily. Tolerating well. No side effects. Helps with her ability to function at work.   ROS: Per HPI  PMH: She reports that she quit smoking about 21 months ago. Her smoking use included cigarettes. She has never used smokeless tobacco. She reports that she drinks about 2.0 standard drinks of alcohol per week. She reports that she does not use drugs.  Objective:  Physical Exam: BP 118/72 (BP Location: Left Arm, Patient Position: Sitting, Cuff Size: Normal)   Pulse 88   Temp 98.6 F (37 C) (Oral)   Ht 5\' 5"  (1.651 m)   Wt 149 lb (67.6 kg)   SpO2 98%   BMI 24.79 kg/m   Gen: NAD, resting comfortably CV: RRR with no murmurs appreciated Pulm: NWOB, CTAB with no crackles, wheezes, or rhonchi Neuro: Grossly normal, moves all extremities Psych: Normal affect and thought content  Assessment/Plan:  Depression, major, single episode, moderate (HCC) Patient requested  depression medication today while she is waiting to see her psychiatrist. She denies any SI or HI. She has tried zoloft in the past which did not work well.  Advised patient that given her concurrent history of depression, anxiety, bipolar, ADHD, this will be best managed by psychiatrist.  She is currently actively looking for one.  We will start Abilify 5 mg daily in the interim as I do not want her to wait weeks with untreated depression.  Would avoid SSRIs/SNRIs given bipolar. Discussed reasons to return to care.  Follow up with me in a few weeks if she has not seen psychiatry by then.   Bipolar disorder (HCC) See above.  Referral to psychiatry is pending.  Continue gabapentin 100 mg 3 times daily and Trileptal 300 mg twice daily.  ADHD Stable.  Continue Vyvanse 30 mg daily. Follow up in 6 months.   Anxiety See above.  ContinueTrileptal 300 mg twice daily and gabapentin 100 mg 3 times daily.  Preventative Healthcare Flu shot given today.   Katina Degree. Jimmey Ralph, MD 04/14/2018 2:59 PM

## 2018-04-14 NOTE — Assessment & Plan Note (Addendum)
Patient requested depression medication today while she is waiting to see her psychiatrist. She denies any SI or HI. She has tried zoloft in the past which did not work well.  Advised patient that given her concurrent history of depression, anxiety, bipolar, ADHD, this will be best managed by psychiatrist.  She is currently actively looking for one.  We will start Abilify 5 mg daily in the interim as I do not want her to wait weeks with untreated depression.  Would avoid SSRIs/SNRIs given bipolar. Discussed reasons to return to care.  Follow up with me in a few weeks if she has not seen psychiatry by then.

## 2018-04-14 NOTE — Patient Instructions (Signed)
It was very nice to see you today!  I will refill your gabapentin and trileptal.  Please also start the abilify.  Come back to see me in 6 months, or sooner as needed.   Take care, Dr Jimmey Ralph

## 2018-04-28 ENCOUNTER — Other Ambulatory Visit: Payer: Self-pay | Admitting: Family Medicine

## 2018-04-28 NOTE — Telephone Encounter (Signed)
Copied from CRM [id number]. Topic: Quick Communication - Rx Refill/Question >> Apr 28, 2018  3:34 PM Stephannie Li, NT wrote: Medication: lisdexamfetamine (VYVANSE) 30 MG capsule  Has the patient contacted their pharmacy? no (Agent: If no, request that the patient contact the pharmacy for the refill. (Agent: If yes, when and what did the pharmacy advise?  Preferred Pharmacy (with phone number or street name [id number] [place], [place] - [address] [phone] (Phone) [phone] (Fax)    Agent: Please be advised that RX refills may take up to 3 business days. We ask that you follow-up with your pharmacy.

## 2018-04-29 MED ORDER — LISDEXAMFETAMINE DIMESYLATE 30 MG PO CAPS: 30.0000 mg | ORAL_CAPSULE | Freq: Every day | ORAL | 0 refills | Status: DC

## 2018-04-29 NOTE — Telephone Encounter (Signed)
Requested medication (s) are due for refill today: yes  Requested medication (s) are on the active medication list: yes    Last refill: 03/31/18 #30  0 refills  Future visit scheduled no  Notes to clinic:Not delegated  Requested Prescriptions  Pending Prescriptions Disp Refills   lisdexamfetamine (VYVANSE) 30 MG capsule 30 capsule 0    Sig: Take 1 capsule (30 mg total) by mouth daily.     Not Delegated - Psychiatry:  Stimulants/ADHD Failed - 04/28/2018  6:00 PM      Failed - This refill cannot be delegated      Failed - Urine Drug Screen completed in last 360 days.      Passed - Valid encounter within last 3 months    Recent Outpatient Visits          2 weeks ago Anxiety   [facility], Katina Degree, MD   4 months ago Flank pain   [facility], Katina Degree, MD   8 months ago Attention deficit hyperactivity disorder (ADHD), unspecified ADHD type   [facility], Katina Degree, MD   11 months ago Attention deficit hyperactivity disorder (ADHD), unspecified ADHD type   [facility], Katina Degree, MD   1 year ago Attention deficit hyperactivity disorder (ADHD), unspecified ADHD type    [facility] [address] Katina Degree, MD

## 2018-04-29 NOTE — Telephone Encounter (Signed)
Ok to fill 

## 2018-05-07 ENCOUNTER — Encounter: Payer: Self-pay | Admitting: Physician Assistant

## 2018-05-07 ENCOUNTER — Ambulatory Visit (INDEPENDENT_AMBULATORY_CARE_PROVIDER_SITE_OTHER): Payer: Managed Care, Other (non HMO) | Admitting: Physician Assistant

## 2018-05-07 VITALS — BP 110/80 | HR 114 | Temp 98.5°F | Ht 65.0 in | Wt 147.0 lb

## 2018-05-07 DIAGNOSIS — H6690 Otitis media, unspecified, unspecified ear: Secondary | ICD-10-CM | POA: Diagnosis not present

## 2018-05-07 DIAGNOSIS — J0100 Acute maxillary sinusitis, unspecified: Secondary | ICD-10-CM | POA: Diagnosis not present

## 2018-05-07 MED ORDER — AMOXICILLIN-POT CLAVULANATE 875-125 MG PO TABS: 1.0000 | ORAL_TABLET | Freq: Two times a day (BID) | ORAL | 0 refills | Status: DC

## 2018-05-07 NOTE — Patient Instructions (Signed)
It was great to see you!  Use medication as prescribed: Augmentin Push fluids and get plenty of rest. Please return if you are not improving as expected, or if you have high fevers (>101.5) or difficulty swallowing or worsening productive cough.  Call clinic with questions.  I hope you start feeling better soon!  

## 2018-05-07 NOTE — Progress Notes (Signed)
Felicia Ellison is a 22 y.o. female here for a new problem.  I acted as a Neurosurgeon for [employer], PA-C Corky Mull, LPN  History of Present Illness:   Chief Complaint  Patient presents with  . Sinus Problem    Sinus Problem  This is a new problem. The current episode started yesterday. The problem has been gradually worsening since onset. There has been no fever. Her pain is at a severity of 7/10. The pain is moderate. Associated symptoms include congestion (Nasal clear drainage), headaches, neck pain, sinus pressure, sneezing and a sore throat (gone today, just yesterday). Pertinent negatives include no chills, ear pain, hoarse voice, shortness of breath or swollen glands. Past treatments include spray decongestants (Sudaphed). The treatment provided no relief.    Past Medical History:  Diagnosis Date  . Anxiety   . Arcuate uterus 2019   noted on pelvic ultrasound  . Depression   . Migraines    without aura     Social History   Socioeconomic History  . Marital status: Single    Spouse name: Not on file  . Number of children: Not on file  . Years of education: Not on file  . Highest education level: Not on file  Occupational History  . Occupation: [employer]  Social Needs  . Financial resource strain: Not on file  . Food insecurity:    Worry: Not on file    Inability: Not on file  . Transportation needs:    Medical: Not on file    Non-medical: Not on file  Tobacco Use  . Smoking status: Former Smoker    Types: Cigarettes    Last attempt to quit: 07/02/2016    Years since quitting: 1.8  . Smokeless tobacco: Never Used  . Tobacco comment: VAPE  Substance and Sexual Activity  . Alcohol use: Yes    Alcohol/week: 2.0 standard drinks    Types: 2 Glasses of wine per week  . Drug use: No  . Sexual activity: Yes    Partners: Female    Birth control/protection: None    Comment: female partner  Lifestyle  . Physical activity:    Days per week: Not on file     Minutes per session: Not on file  . Stress: Not on file  Relationships  . Social connections:    Talks on phone: Not on file    Gets together: Not on file    Attends religious service: Not on file    Active member of club or organization: Not on file    Attends meetings of clubs or organizations: Not on file    Relationship status: Not on file  . Intimate partner violence:    Fear of current or ex partner: Not on file    Emotionally abused: Not on file    Physically abused: Not on file    Forced sexual activity: Not on file  Other Topics Concern  . Not on file  Social History Narrative  . Not on file    History reviewed. No pertinent surgical history.  Family History  Problem Relation Age of Onset  . Hypothyroidism Mother   . Anxiety disorder Mother   . Anxiety disorder Father   . Cancer Maternal Grandmother        Brain  . Hypertension Paternal Grandmother   . Heart attack Paternal Grandfather     Allergies  Allergen Reactions  . Buspar [Buspirone] Other (See Comments)    *Dizziness with Nausea  Current Medications:   Current Outpatient Medications:  .  ARIPiprazole (ABILIFY) 5 MG tablet, Take 1 tablet (5 mg total) by mouth daily., Disp: 30 tablet, Rfl: 11 .  gabapentin (NEURONTIN) 100 MG capsule, Take 1 capsule (100 mg total) by mouth 3 (three) times daily., Disp: 90 capsule, Rfl: 2 .  lisdexamfetamine (VYVANSE) 30 MG capsule, Take 1 capsule (30 mg total) by mouth daily., Disp: 30 capsule, Rfl: 0 .  OXcarbazepine (TRILEPTAL) 300 MG tablet, Take 1 tablet (300 mg total) by mouth 2 (two) times daily., Disp: 60 tablet, Rfl: 2 .  amoxicillin-clavulanate (AUGMENTIN) 875-125 MG tablet, Take 1 tablet by mouth 2 (two) times daily., Disp: 20 tablet, Rfl: 0   Review of Systems:   Review of Systems  Constitutional: Negative for chills.  HENT: Positive for congestion (Nasal clear drainage), sinus pressure, sneezing and sore throat (gone today, just yesterday). Negative  for ear pain and hoarse voice.   Respiratory: Negative for shortness of breath.   Musculoskeletal: Positive for neck pain.  Neurological: Positive for headaches.    Vitals:   Vitals:   05/07/18 1530  BP: 110/80  Pulse: (!) 114  Temp: 98.5 F (36.9 C)  TempSrc: Oral  SpO2: 96%  Weight: 147 lb (66.7 kg)  Height: 5\' 5"  (1.651 m)     Body mass index is 24.46 kg/m.  Physical Exam:   Physical Exam  Constitutional: She appears well-developed. She is cooperative.  Non-toxic appearance. She does not have a sickly appearance. She does not appear ill. No distress.  HENT:  Head: Normocephalic and atraumatic.  Right Ear: Tympanic membrane, external ear and ear canal normal. Tympanic membrane is not erythematous, not retracted and not bulging.  Left Ear: External ear and ear canal normal. Tympanic membrane is not erythematous, not retracted and not bulging. A middle ear effusion is present.  Nose: Right sinus exhibits maxillary sinus tenderness. Right sinus exhibits no frontal sinus tenderness. Left sinus exhibits maxillary sinus tenderness. Left sinus exhibits no frontal sinus tenderness.  Mouth/Throat: Uvula is midline and mucous membranes are normal. Posterior oropharyngeal erythema present. No posterior oropharyngeal edema. Tonsils are 1+ on the right. Tonsils are 1+ on the left.  Eyes: Conjunctivae and lids are normal.  Neck: Trachea normal.  Cardiovascular: Normal rate, regular rhythm, S1 normal, S2 normal and normal heart sounds.  Pulmonary/Chest: Effort normal and breath sounds normal. She has no decreased breath sounds. She has no wheezes. She has no rhonchi. She has no rales.  Lymphadenopathy:    She has no cervical adenopathy.  Neurological: She is alert.  Skin: Skin is warm, dry and intact.  Psychiatric: She has a normal mood and affect. Her speech is normal and behavior is normal.  Nursing note and vitals reviewed.   Assessment and Plan:    Guiselle was seen today for  sinus problem.  Diagnoses and all orders for this visit:  Acute otitis media, unspecified otitis media type -     Cancel: POCT rapid strep A  Acute maxillary sinusitis, recurrence not specified  Other orders -     amoxicillin-clavulanate (AUGMENTIN) 875-125 MG tablet; Take 1 tablet by mouth 2 (two) times daily.   No red flags on exam.  Will initiate Augmentin per orders. Discussed taking medications as prescribed. Reviewed return precautions including worsening fever, SOB, worsening cough or other concerns. Push fluids and rest. I recommend that patient follow-up if symptoms worsen or persist despite treatment x 7-10 days, sooner if needed.  . Reviewed expectations re:  course of current medical issues. . Discussed self-management of symptoms. . Outlined signs and symptoms indicating need for more acute intervention. . Patient verbalized understanding and all questions were answered. . See orders for this visit as documented in the electronic medical record. . Patient received an After-Visit Summary.  CMA or LPN served as scribe during this visit. History, Physical, and Plan performed by medical provider. The above documentation has been reviewed and is accurate and complete.  Jarold Motto, PA-C

## 2018-06-03 ENCOUNTER — Other Ambulatory Visit: Payer: Self-pay | Admitting: Family Medicine

## 2018-06-03 MED ORDER — LISDEXAMFETAMINE DIMESYLATE 30 MG PO CAPS: 30.0000 mg | ORAL_CAPSULE | Freq: Every day | ORAL | 0 refills | Status: DC

## 2018-06-03 NOTE — Telephone Encounter (Signed)
Ok to fill pt does not have f/u app

## 2018-06-03 NOTE — Telephone Encounter (Signed)
Rx sent in.  [name], MD 06/03/2018 8:25 AM

## 2018-06-24 ENCOUNTER — Other Ambulatory Visit: Payer: Self-pay | Admitting: Family Medicine

## 2018-06-24 MED ORDER — GABAPENTIN 100 MG PO CAPS: ORAL_CAPSULE | ORAL | 1 refills | Status: DC

## 2018-06-24 NOTE — Telephone Encounter (Signed)
Pt states that this isn't at the pharmacy.  Status said failed.  Could it be resent.

## 2018-06-24 NOTE — Telephone Encounter (Signed)
Previous refill of today failed; resent

## 2018-06-24 NOTE — Addendum Note (Signed)
Addended by: Carlean PurlBARBOUR, Dhalia Zingaro on: 06/24/2018 02:55 PM   Modules accepted: Orders

## 2018-06-30 ENCOUNTER — Other Ambulatory Visit: Payer: Self-pay | Admitting: Family Medicine

## 2018-06-30 ENCOUNTER — Other Ambulatory Visit: Payer: Self-pay

## 2018-06-30 MED ORDER — LISDEXAMFETAMINE DIMESYLATE 30 MG PO CAPS: 30.0000 mg | ORAL_CAPSULE | Freq: Every day | ORAL | 0 refills | Status: DC

## 2018-06-30 NOTE — Telephone Encounter (Signed)
Ok with me. Please print out rx and I will sign.  [name], MD 06/30/2018 2:19 PM

## 2018-06-30 NOTE — Telephone Encounter (Signed)
Patient came to office and picked up prescription.

## 2018-06-30 NOTE — Telephone Encounter (Signed)
Please advise 

## 2018-06-30 NOTE — Telephone Encounter (Signed)
See note

## 2018-06-30 NOTE — Telephone Encounter (Signed)
Copied from CRM [id number]. Topic: Quick Communication - Rx Refill/Question >> Jun 30, 2018 10:26 AM Baldo DaubAlexander, Amber L wrote: Medication: lisdexamfetamine (VYVANSE) 30 MG capsule  Has the patient contacted their pharmacy? Yes - states she is completely out of medication and that she requested via MyChart a few days ago  (Agent: If no, request that the patient contact the pharmacy for the refill.) (Agent: If yes, when and what did the pharmacy advise?)  Preferred Pharmacy (with phone number or street name): [id number] [place] - [address] [phone] (Phone) [phone] (Fax)  Agent: Please be advised that RX refills may take up to 3 business days. We ask that you follow-up with your pharmacy.

## 2018-06-30 NOTE — Telephone Encounter (Signed)
Patient is calling and states she is out of this medication and would like to know if she can pick up a paper copy if this will be quicker. Patient requesting this medication today. I did inform patient of the 24-72 business hour turn around time. Please contact patient.   CB# [phone]

## 2018-07-09 ENCOUNTER — Other Ambulatory Visit: Payer: Self-pay | Admitting: Family Medicine

## 2018-07-22 ENCOUNTER — Other Ambulatory Visit: Payer: Self-pay | Admitting: Family Medicine

## 2018-07-22 NOTE — Telephone Encounter (Signed)
Please advise 

## 2018-07-23 NOTE — Telephone Encounter (Signed)
Rx last filled on 06/30/18.   She cannot get a refill this early.  Will deny request.  Katina Degree. Jimmey Ralph, MD 07/23/2018 8:03 AM

## 2018-07-29 ENCOUNTER — Other Ambulatory Visit: Payer: Self-pay | Admitting: Family Medicine

## 2018-07-29 MED ORDER — LISDEXAMFETAMINE DIMESYLATE 30 MG PO CAPS: 30.0000 mg | ORAL_CAPSULE | Freq: Every day | ORAL | 0 refills | Status: DC

## 2018-07-29 NOTE — Telephone Encounter (Signed)
Requested medication (s) are due for refill today: Yes  Requested medication (s) are on the active medication list: Yes  Last refill:  06/30/18  Future visit scheduled: No  Notes to clinic:  Unable to refill per protocol     Requested Prescriptions  Pending Prescriptions Disp Refills   lisdexamfetamine (VYVANSE) 30 MG capsule 30 capsule 0    Sig: Take 1 capsule (30 mg total) by mouth daily.     Not Delegated - Psychiatry:  Stimulants/ADHD Failed - 07/29/2018  3:49 PM      Failed - This refill cannot be delegated      Failed - Urine Drug Screen completed in last 360 days.      Passed - Valid encounter within last 3 months    Recent Outpatient Visits          2 months ago Acute otitis media, unspecified otitis media type   [facility] [place], [place], [place]   3 months ago Anxiety   [facility] [place], Katina Degree, MD   7 months ago Flank pain   [facility] [place], Katina Degree, MD   11 months ago Attention deficit hyperactivity disorder (ADHD), unspecified ADHD type   [facility] [place], Katina Degree, MD   1 year ago Attention deficit hyperactivity disorder (ADHD), unspecified ADHD type   [facility] [address], Katina Degree, MD

## 2018-07-29 NOTE — Telephone Encounter (Signed)
Copied from CRM [id number]. Topic: General - Other >> Jul 29, 2018  3:29 PM Leafy Ro wrote: Reason for CRM: pt needs refill on vyvanse 30 mg . [facility] [address] Pt has one pill left

## 2018-07-29 NOTE — Telephone Encounter (Signed)
See note

## 2018-07-29 NOTE — Telephone Encounter (Signed)
Please advise 

## 2018-07-29 NOTE — Telephone Encounter (Signed)
Rx sent in.  Felicia Ellison. Jimmey Ralph, MD 07/29/2018 5:07 PM

## 2018-08-24 ENCOUNTER — Other Ambulatory Visit: Payer: Self-pay | Admitting: Family Medicine

## 2018-08-25 MED ORDER — LISDEXAMFETAMINE DIMESYLATE 30 MG PO CAPS: 30.0000 mg | ORAL_CAPSULE | Freq: Every day | ORAL | 0 refills | Status: DC

## 2018-08-25 NOTE — Progress Notes (Signed)
23 y.o. G0P0 Single Caucasian female here for annual exam.   Patient wants to discuss birth control options. Patient also complaining of painful intercourse, high in vagina. Bilateral breast pain.  Her breast pain occurs only when she pushes on her breasts.  Skin texture of nipple has changed.  New detergent, now using unscented. Uncertain if she feels breast lumps or normal tissue.  UPT negative 2 weeks ago.   Pelvic US on 09/12/17 for dysmenorrhea and dyspareunia. It was normal other than arcuate uterus.  Normal ovaries.   Took Seasonique for 1 - 1.5 months.  Had emotional changes so she stopped. Did not like NuvaRing.  This was not comfortable.  Wants OrthoEvra. No personal hx of liver disease, DVT/PE, HTN.  Hx of migraines and menstrual migraines.  No aura.   Had labs for irregular menses.  LH > FSH.  Normal TSH and prolactin.  PCP: Jacquiline Doe, MD    Patient's last menstrual period was 08/07/2018 (exact date).     Period Duration (Days): 4-6 days Period Pattern: (!) Irregular Menstrual Flow: Heavy Menstrual Control: Tampon Menstrual Control Change Freq (Hours): changes tampon every 3-4 hrs on heaviest day Dysmenorrhea: (!) Severe Dysmenorrhea Symptoms: Cramping, Nausea, Diarrhea, Headache     Sexually active: Yes.   female The current method of family planning is condoms sometimes.    Exercising: No.  The patient does not participate in regular exercise at present. Smoker:  yes--Vapes--pt. Quit cigarettes and hoping to quit vaping  Health Maintenance: Pap:  never History of abnormal Pap:  n/a MMG:  n/a Colonoscopy:  n/a BMD:   n/a  Result  n/a TDaP:  08-17-17 Gardasil:   May have done 1 injection HIV: 08-14-17 NR Hep C: 08-23-17 Neg Screening Labs:  ---   reports that she quit smoking about 2 years ago. Her smoking use included cigarettes. She has never used smokeless tobacco. She reports current alcohol use of about 1.0 standard drinks of alcohol per week. She  reports that she does not use drugs.  Past Medical History:  Diagnosis Date  . Anxiety   . Arcuate uterus 2019   noted on pelvic ultrasound  . Depression   . Migraines    without aura    History reviewed. No pertinent surgical history.  Current Outpatient Medications  Medication Sig Dispense Refill  . gabapentin (NEURONTIN) 100 MG capsule TAKE 1 CAPSULE BY MOUTH THREE TIMES A DAY 270 capsule 1  . lisdexamfetamine (VYVANSE) 30 MG capsule Take 1 capsule (30 mg total) by mouth daily. 30 capsule 0   No current facility-administered medications for this visit.     Family History  Problem Relation Age of Onset  . Hypothyroidism Mother   . Anxiety disorder Mother   . Anxiety disorder Father   . Cancer Maternal Grandmother        Brain  . Hypertension Paternal Grandmother   . Heart attack Paternal Grandfather     Review of Systems  Skin:       C/o of nipple discharge and pain  All other systems reviewed and are negative.   Exam:   BP 106/64 (BP Location: Right Arm, Patient Position: Sitting, Cuff Size: Normal)   Pulse (!) 112   Resp (!) 24   Ht 5\' 5"  (1.651 m)   Wt 149 lb 9.6 oz (67.9 kg)   LMP 08/07/2018 (Exact Date)   BMI 24.89 kg/m     General appearance: alert, cooperative and appears stated age Head: Normocephalic, without obvious  abnormality, atraumatic Neck: no adenopathy, supple, symmetrical, trachea midline and thyroid normal to inspection and palpation Lungs: clear to auscultation bilaterally Breasts: normal appearance, no masses or tenderness, No nipple retraction or dimpling, No nipple discharge or bleeding, dry skin at nipples,  No axillary or supraclavicular adenopathy Heart: regular rate and rhythm Abdomen: soft, non-tender; no masses, no organomegaly Extremities: extremities normal, atraumatic, no cyanosis or edema Skin: Skin color, texture, turgor normal. No rashes or lesions Lymph nodes: Cervical, supraclavicular, and axillary nodes normal. No  abnormal inguinal nodes palpated Neurologic: Grossly normal  Pelvic: External genitalia:  no lesions              Urethra:  normal appearing urethra with no masses, tenderness or lesions              Bartholins and Skenes: normal                 Vagina: normal appearing vagina with normal color and discharge, no lesions              Cervix: no lesions              Pap taken: No. Bimanual Exam:  Uterus:  normal size, contour, position, consistency, mobility, non-tender              Adnexa: no mass, fullness, tenderness                 Chaperone was present for exam.  Assessment:   Well woman visit with normal exam. Menorrhagia with irregular menses.  Severe dysmenorrhea. Migraines without aura.  Menstrual migraines.  Dry nipples.   Plan: Mammogram screening. Recommended self breast awareness. Pap and HR HPV as above. Guidelines for Calcium, Vitamin D, regular exercise program including cardiovascular and weight bearing exercise. Rx for compounded nipple cream.  STD screening.  OrthoEvra x 12 mo.  Discussed risk of thromboembolic events.  Follow up annually and prn.   After visit summary provided.

## 2018-08-25 NOTE — Telephone Encounter (Signed)
Please advise 

## 2018-08-27 ENCOUNTER — Encounter: Payer: Self-pay | Admitting: Obstetrics and Gynecology

## 2018-08-27 ENCOUNTER — Other Ambulatory Visit: Payer: Self-pay

## 2018-08-27 ENCOUNTER — Ambulatory Visit (INDEPENDENT_AMBULATORY_CARE_PROVIDER_SITE_OTHER): Payer: Managed Care, Other (non HMO) | Admitting: Obstetrics and Gynecology

## 2018-08-27 ENCOUNTER — Telehealth: Payer: Self-pay | Admitting: Obstetrics and Gynecology

## 2018-08-27 VITALS — BP 106/64 | HR 112 | Resp 24 | Ht 65.0 in | Wt 149.6 lb

## 2018-08-27 DIAGNOSIS — N644 Mastodynia: Secondary | ICD-10-CM | POA: Diagnosis not present

## 2018-08-27 DIAGNOSIS — Z01419 Encounter for gynecological examination (general) (routine) without abnormal findings: Secondary | ICD-10-CM

## 2018-08-27 DIAGNOSIS — Z113 Encounter for screening for infections with a predominantly sexual mode of transmission: Secondary | ICD-10-CM

## 2018-08-27 LAB — POCT URINE PREGNANCY: Preg Test, Ur: NEGATIVE

## 2018-08-27 MED ORDER — NONFORMULARY OR COMPOUNDED ITEM: 0 refills | Status: DC

## 2018-08-27 MED ORDER — NORELGESTROMIN-ETH ESTRADIOL 150-35 MCG/24HR TD PTWK: 1.0000 | MEDICATED_PATCH | TRANSDERMAL | 11 refills | Status: DC

## 2018-08-27 NOTE — Patient Instructions (Signed)

## 2018-08-28 LAB — RPR: RPR Ser Ql: NONREACTIVE

## 2018-08-28 LAB — HEPATITIS B SURFACE ANTIGEN: Hepatitis B Surface Ag: NEGATIVE

## 2018-08-28 LAB — HIV ANTIBODY (ROUTINE TESTING W REFLEX): HIV Screen 4th Generation wRfx: NONREACTIVE

## 2018-08-28 LAB — HEPATITIS C ANTIBODY

## 2018-08-29 LAB — CHLAMYDIA/GONOCOCCUS/TRICHOMONAS, NAA
Chlamydia by NAA: NEGATIVE
Gonococcus by NAA: NEGATIVE
Trich vag by NAA: NEGATIVE

## 2018-09-24 ENCOUNTER — Other Ambulatory Visit: Payer: Self-pay | Admitting: Family Medicine

## 2018-09-24 NOTE — Telephone Encounter (Signed)
Please advise 

## 2018-09-25 MED ORDER — LISDEXAMFETAMINE DIMESYLATE 30 MG PO CAPS: 30.0000 mg | ORAL_CAPSULE | Freq: Every day | ORAL | 0 refills | Status: DC

## 2018-09-25 NOTE — Telephone Encounter (Signed)
Please schedule patient for Webex within the next month.  She will need this in order to continue getting refills on her medication.  Thank you!

## 2018-09-25 NOTE — Telephone Encounter (Signed)
Will send in rx. Needs OV for further refills. Ok to do webex.

## 2018-10-15 ENCOUNTER — Other Ambulatory Visit: Payer: Self-pay | Admitting: Family Medicine

## 2018-10-15 NOTE — Telephone Encounter (Signed)
Requested medication (s) are due for refill today: Yes  Requested medication (s) are on the active medication list: Yes  Last refill:  06/24/18  Future visit scheduled: No  Notes to clinic:  See request    Requested Prescriptions  Pending Prescriptions Disp Refills   gabapentin (NEURONTIN) 100 MG capsule 270 capsule 0    Sig: TAKE 1 CAPSULE BY MOUTH THREE TIMES A DAY     Neurology: Anticonvulsants - gabapentin Passed - 10/15/2018  1:42 PM      Passed - Valid encounter within last 12 months    Recent Outpatient Visits          5 months ago Acute otitis media, unspecified otitis media type   [facility] [place], [place], [place]   6 months ago Anxiety   [facility] [place], Katina Degree, MD   10 months ago Flank pain   [facility] [place], Katina Degree, MD   1 year ago Attention deficit hyperactivity disorder (ADHD), unspecified ADHD type   [facility] [place], Katina Degree, MD   1 year ago Attention deficit hyperactivity disorder (ADHD), unspecified ADHD type   [facility] [address] Katina Degree, MD

## 2018-10-16 NOTE — Telephone Encounter (Signed)
See note

## 2018-10-17 ENCOUNTER — Other Ambulatory Visit: Payer: Self-pay | Admitting: Family Medicine

## 2018-10-17 NOTE — Telephone Encounter (Signed)
Requested medication (s) are due for refill today:  Due 10/26/18  Requested medication (s) are on the active medication list:  Yes   Future visit scheduled:  Yes  Last Refill: 09/25/18; #30;  no refills  Requested Prescriptions  Pending Prescriptions Disp Refills   lisdexamfetamine (VYVANSE) 30 MG capsule 30 capsule 0    Sig: Take 1 capsule (30 mg total) by mouth daily.     Not Delegated - Psychiatry:  Stimulants/ADHD Failed - 10/17/2018 12:46 PM      Failed - This refill cannot be delegated      Failed - Urine Drug Screen completed in last 360 days.      Failed - Valid encounter within last 3 months    Recent Outpatient Visits          5 months ago Acute otitis media, unspecified otitis media type   [facility] [address] [place], [place], [place]   6 months ago Anxiety   [facility] [place], Katina Degree, MD   10 months ago Flank pain   [facility] [place], Katina Degree, MD   1 year ago Attention deficit hyperactivity disorder (ADHD), unspecified ADHD type   [facility] [place], Katina Degree, MD   1 year ago Attention deficit hyperactivity disorder (ADHD), unspecified ADHD type   [facility] [place], Katina Degree, MD      Future Appointments            In 3 days Ardith Dark, MD [facility] [place], [facility]         Refused Prescriptions Disp Refills   gabapentin (NEURONTIN) 100 MG capsule 270 capsule 1    Sig: TAKE 1 CAPSULE BY MOUTH THREE TIMES A DAY     Neurology: Anticonvulsants - gabapentin Passed - 10/17/2018 12:46 PM      Passed - Valid encounter within last 12 months    Recent Outpatient Visits          5 months ago Acute otitis media, unspecified otitis media type   [facility] [place], [place], [place]   6 months ago Anxiety   [facility] [place], Katina Degree, MD   10 months ago Flank pain   [facility]  [place], Katina Degree, MD   1 year ago Attention deficit hyperactivity disorder (ADHD), unspecified ADHD type   [facility] [place], Katina Degree, MD   1 year ago Attention deficit hyperactivity disorder (ADHD), unspecified ADHD type   [facility] [place], Katina Degree, MD      Future Appointments            In 3 days Jimmey Ralph, Katina Degree, MD [facility] [place], [facility]

## 2018-10-17 NOTE — Telephone Encounter (Signed)
Felicia Ellison, schedule doxy appt for refill. No visit in the last 3 months

## 2018-10-17 NOTE — Telephone Encounter (Signed)
Schedule doxy appt for refills

## 2018-10-20 ENCOUNTER — Ambulatory Visit (INDEPENDENT_AMBULATORY_CARE_PROVIDER_SITE_OTHER): Payer: Managed Care, Other (non HMO) | Admitting: Family Medicine

## 2018-10-20 ENCOUNTER — Encounter: Payer: Self-pay | Admitting: Family Medicine

## 2018-10-20 DIAGNOSIS — F319 Bipolar disorder, unspecified: Secondary | ICD-10-CM | POA: Diagnosis not present

## 2018-10-20 DIAGNOSIS — F909 Attention-deficit hyperactivity disorder, unspecified type: Secondary | ICD-10-CM

## 2018-10-20 MED ORDER — GABAPENTIN 100 MG PO CAPS: ORAL_CAPSULE | ORAL | 3 refills | Status: DC

## 2018-10-20 MED ORDER — LISDEXAMFETAMINE DIMESYLATE 30 MG PO CAPS: 30.0000 mg | ORAL_CAPSULE | Freq: Every day | ORAL | 0 refills | Status: DC

## 2018-10-20 NOTE — Assessment & Plan Note (Signed)
Stable.  Continue gabapentin 100 mg 3 times daily. 

## 2018-10-20 NOTE — Assessment & Plan Note (Signed)
Stable.  Database reviewed with no red flags.  Continue Vyvanse 30 mg daily.  Follow-up in 6 months.

## 2018-10-20 NOTE — Progress Notes (Signed)
    Chief Complaint:  Felicia Ellison is a 23 y.o. female who presents today for a virtual office visit with a chief complaint of ADHD.   Assessment/Plan:  Bipolar disorder (HCC) Stable.  Continue gabapentin 100 mg 3 times daily.  ADHD Stable.  Database reviewed with no red flags.  Continue Vyvanse 30 mg daily.  Follow-up in 6 months.    Subjective:  HPI:  # ADHD - On vyvanse 30mg  daily and tolerating well without side effects. -Medications help with her ability to focus and stay on task at work. -ROS: No reported palpitations or chest pain  # Bipolar Disorder - On gabapentin 100mg  three times daily and tolerating well - ROS: No SI or HI.  ROS: Per HPI  PMH: She reports that she quit smoking about 2 years ago. Her smoking use included cigarettes. She has never used smokeless tobacco. She reports current alcohol use of about 1.0 standard drinks of alcohol per week. She reports that she does not use drugs.      Objective/Observations  Physical Exam: Gen: NAD, resting comfortably Pulm: Normal work of breathing Neuro: Grossly normal, moves all extremities Psych: Normal affect and thought content  Virtual Visit via Video   I connected with Sloan Leiter on 10/20/18 at  1:40 PM EDT by a video enabled telemedicine application and verified that I am speaking with the correct person using two identifiers. I discussed the limitations of evaluation and management by telemedicine and the availability of in person appointments. The patient expressed understanding and agreed to proceed.   Patient location: Home Provider location:  [facility] Persons participating in the virtual visit: Myself and Patient     Katina Degree. Jimmey Ralph, MD 10/20/2018 1:58 PM

## 2018-11-18 ENCOUNTER — Ambulatory Visit (INDEPENDENT_AMBULATORY_CARE_PROVIDER_SITE_OTHER): Payer: Managed Care, Other (non HMO) | Admitting: Physician Assistant

## 2018-11-18 ENCOUNTER — Ambulatory Visit: Payer: Managed Care, Other (non HMO) | Admitting: Family Medicine

## 2018-11-18 ENCOUNTER — Encounter: Payer: Self-pay | Admitting: Physician Assistant

## 2018-11-18 VITALS — Ht 65.0 in | Wt 145.0 lb

## 2018-11-18 DIAGNOSIS — M545 Low back pain, unspecified: Secondary | ICD-10-CM

## 2018-11-18 DIAGNOSIS — M79645 Pain in left finger(s): Secondary | ICD-10-CM

## 2018-11-18 DIAGNOSIS — M79605 Pain in left leg: Secondary | ICD-10-CM

## 2018-11-18 DIAGNOSIS — M79644 Pain in right finger(s): Secondary | ICD-10-CM | POA: Diagnosis not present

## 2018-11-18 DIAGNOSIS — M79604 Pain in right leg: Secondary | ICD-10-CM

## 2018-11-18 NOTE — Progress Notes (Signed)
Virtual Visit via Video   I connected with Felicia Ellison on 11/19/18 at  2:20 PM EDT by a video enabled telemedicine application and verified that I am speaking with the correct person using two identifiers. Location patient: Home Location provider:  [facility], Office Persons participating in the virtual visit: Felicia Ellison, Felicia Ellison, Felicia Mullonna Orphanos, LPN   I discussed the limitations of evaluation and management by telemedicine and the availability of in person appointments. The patient expressed understanding and agreed to proceed.  I acted as a Neurosurgeonscribe for Energy East CorporationSamantha Naureen Benton, [name], LPN  Subjective:   HPI:   Leg pain/Back pain Patient reports that about a month ago she woke up with muscle soreness in her bilateral thighs. She has difficulty getting out of bed. Denies muscle pain in lower legs. As the day progresses, the pain subsides. Has history of low back pain. Has low back stiffness each morning and pain throughout the day whenever bending over. She doesn't taking anything for her symptoms, doesn't want to have to take more daily medication. She does endorse that she does not drink hardly any water throughout the day. Doesn't exercise but job requires her to be on her feet constantly. Denies redness to legs or swelling. Denies any rashes on entire body. Denies upper arm weakness. Denies saddle paresthesia, numbness/tingling to b/l legs.  Bilateral thumb pain Approximately 1 week ago she started having bilateral thumb pain. Pt is wearing a brace on right thumb and hand and this helps. Denies joint pain or swelling. She does state that she does "absolute everything" on her cell phone -- for work, for pleasure, etc. She uses her phone multiple hours a day. Denies radiation of pain to wrist or any inciting injury.  Patient's mother has arthritis and her maternal aunt has fibromyalgia.   ROS: See pertinent positives and negatives per HPI.  Patient Active  Problem List   Diagnosis Date Noted  . Bipolar disorder (HCC) 04/14/2018  . Cheilitis 12/06/2017  . 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 Dysmenorrhea 08/25/2017  . Dyspareunia in female 08/25/2017  . Irregular menses 08/25/2017  . Anxiety 05/17/2017  . ADHD 04/16/2017  . Migraines 04/16/2017  . Depression, major, single episode, moderate (HCC) 04/16/2017    Social History   Tobacco Use  . Smoking status: Former Smoker    Types: Cigarettes    Last attempt to quit: 07/02/2016    Years since quitting: 2.3  . Smokeless tobacco: Never Used  . Tobacco comment: VAPE  Substance Use Topics  . Alcohol use: Yes    Alcohol/week: 1.0 standard drinks    Types: 1 Glasses of wine per week    Current Outpatient Medications:  .  gabapentin (NEURONTIN) 100 MG capsule, TAKE 1 CAPSULE BY MOUTH THREE TIMES A DAY, Disp: 270 capsule, Rfl: 3 .  lisdexamfetamine (VYVANSE) 30 MG capsule, Take 1 capsule (30 mg total) by mouth daily., Disp: 30 capsule, Rfl: 0 .  norelgestromin-ethinyl estradiol (ORTHO EVRA) 150-35 MCG/24HR transdermal patch, Place 1 patch onto the skin once a week., Disp: 3 patch, Rfl: 11  Allergies  Allergen Reactions  . Buspar [Buspirone] Other (See Comments)    *Dizziness with Nausea     Objective:   VITALS: Per patient if applicable, see vitals. GENERAL: Alert, appears well and in no acute distress. HEENT: Atraumatic, conjunctiva clear, no obvious abnormalities on inspection of external nose and ears. NECK: Normal movements of the head and neck. CARDIOPULMONARY: No increased WOB. Speaking in clear sentences. I:E  ratio WNL.  MS: Moves all visible extremities without noticeable abnormality. No visual abnormalities to bilateral thumbs, appears to have normal ROM. PSYCH: Pleasant and cooperative, well-groomed. Speech normal rate and rhythm. Affect is appropriate. Insight and judgement are appropriate. Attention is focused, linear, and appropriate.  NEURO: CN grossly intact. Oriented as  arrived to appointment on time with no prompting. Moves both UE equally.  SKIN: No obvious lesions, wounds, erythema, or cyanosis noted on face or hands.  Assessment and Plan:   Felicia Ellison was seen today for bilateral leg pain and bilateral thumb pain.  Diagnoses and all orders for this visit:  Pain in both lower extremities; Acute bilateral low back pain without sciatica New problem. Uncontrolled. Unclear etiology. Will obtain cursory autoimmune work-up as well as basic labs for further investigation. With her increasing low back stiffness, will also obtain lumbar image for further evaluation. If labs WNL, will refer to neuro for further evaluation. Did recommend adequate hydration and possible electrolyte replacement solution such as gatorade. -     CBC with Differential/Platelet; Future -     Sedimentation rate; Future -     C-reactive protein; Future -     Comprehensive metabolic panel; Future -     TSH; Future -     ANA; Future -     CK (Creatine Kinase); Future -     Rheumatoid factor; Future  Bilateral thumb pain New problem. Uncontrolled. Likely overuse injury. Consider DeQuervain's tenosynovitis. Encouraged continued use of brace. Ibuprofen to see if this helps. Could consider sports med referral for eval/possible injection if becomes severe.  . Reviewed expectations re: course of current medical issues. . Discussed self-management of symptoms. . Outlined signs and symptoms indicating need for more acute intervention. . Patient verbalized understanding and all questions were answered. Marland Kitchen Health Maintenance issues including appropriate healthy diet, exercise, and smoking avoidance were discussed with patient. . See orders for this visit as documented in the electronic medical record.  I discussed the assessment and treatment plan with the patient. The patient was provided an opportunity to ask questions and all were answered. The patient agreed with the plan and demonstrated an  understanding of the instructions.   The patient was advised to call back or seek an in-person evaluation if the symptoms worsen or if the condition fails to improve as anticipated.   CMA or LPN served as scribe during this visit. History, Physical, and Plan performed by medical provider. The above documentation has been reviewed and is accurate and complete.  [place], [place] 11/19/2018

## 2018-11-19 ENCOUNTER — Other Ambulatory Visit: Payer: Self-pay | Admitting: Family Medicine

## 2018-11-19 ENCOUNTER — Encounter: Payer: Self-pay | Admitting: Physician Assistant

## 2018-11-20 MED ORDER — LISDEXAMFETAMINE DIMESYLATE 30 MG PO CAPS: 30.0000 mg | ORAL_CAPSULE | Freq: Every day | ORAL | 0 refills | Status: DC

## 2018-11-20 NOTE — Telephone Encounter (Signed)
Please advise 

## 2018-11-21 ENCOUNTER — Other Ambulatory Visit: Payer: Managed Care, Other (non HMO)

## 2018-11-21 ENCOUNTER — Other Ambulatory Visit (INDEPENDENT_AMBULATORY_CARE_PROVIDER_SITE_OTHER): Payer: Managed Care, Other (non HMO)

## 2018-11-21 ENCOUNTER — Ambulatory Visit (INDEPENDENT_AMBULATORY_CARE_PROVIDER_SITE_OTHER): Payer: Managed Care, Other (non HMO)

## 2018-11-21 ENCOUNTER — Other Ambulatory Visit: Payer: Self-pay

## 2018-11-21 DIAGNOSIS — M79644 Pain in right finger(s): Secondary | ICD-10-CM

## 2018-11-21 DIAGNOSIS — M79605 Pain in left leg: Secondary | ICD-10-CM

## 2018-11-21 DIAGNOSIS — M79645 Pain in left finger(s): Secondary | ICD-10-CM | POA: Diagnosis not present

## 2018-11-21 DIAGNOSIS — M545 Low back pain, unspecified: Secondary | ICD-10-CM

## 2018-11-21 DIAGNOSIS — M79604 Pain in right leg: Secondary | ICD-10-CM | POA: Diagnosis not present

## 2018-11-21 LAB — CK: Total CK: 29 U/L (ref 7–177)

## 2018-11-21 LAB — CBC WITH DIFFERENTIAL/PLATELET
Basophils Absolute: 0 10*3/uL (ref 0.0–0.1)
Basophils Relative: 0.8 % (ref 0.0–3.0)
Eosinophils Absolute: 0.1 10*3/uL (ref 0.0–0.7)
Eosinophils Relative: 1.2 % (ref 0.0–5.0)
HCT: 40.2 % (ref 36.0–46.0)
Hemoglobin: 13.9 g/dL (ref 12.0–15.0)
Lymphocytes Relative: 23.8 % (ref 12.0–46.0)
Lymphs Abs: 1.3 10*3/uL (ref 0.7–4.0)
MCHC: 34.6 g/dL (ref 30.0–36.0)
MCV: 88.9 fl (ref 78.0–100.0)
Monocytes Absolute: 0.4 10*3/uL (ref 0.1–1.0)
Monocytes Relative: 7.8 % (ref 3.0–12.0)
Neutro Abs: 3.7 10*3/uL (ref 1.4–7.7)
Neutrophils Relative %: 66.4 % (ref 43.0–77.0)
Platelets: 267 10*3/uL (ref 150.0–400.0)
RBC: 4.52 Mil/uL (ref 3.87–5.11)
RDW: 12.6 % (ref 11.5–15.5)
WBC: 5.7 10*3/uL (ref 4.0–10.5)

## 2018-11-21 LAB — COMPREHENSIVE METABOLIC PANEL
ALT: 15 U/L (ref 0–35)
AST: 16 U/L (ref 0–37)
Albumin: 4.2 g/dL (ref 3.5–5.2)
Alkaline Phosphatase: 23 U/L — ABNORMAL LOW (ref 39–117)
BUN: 11 mg/dL (ref 6–23)
CO2: 27 mEq/L (ref 19–32)
Calcium: 9.4 mg/dL (ref 8.4–10.5)
Chloride: 105 mEq/L (ref 96–112)
Creatinine, Ser: 0.93 mg/dL (ref 0.40–1.20)
GFR: 75.95 mL/min (ref >60.00)
Glucose, Bld: 63 mg/dL — ABNORMAL LOW (ref 70–99)
Potassium: 4.3 mEq/L (ref 3.5–5.1)
Sodium: 140 mEq/L (ref 135–145)
Total Bilirubin: 0.4 mg/dL (ref 0.2–1.2)
Total Protein: 6.9 g/dL (ref 6.0–8.3)

## 2018-11-21 LAB — SEDIMENTATION RATE: Sed Rate: 19 mm/hr (ref 0–20)

## 2018-11-21 LAB — C-REACTIVE PROTEIN: CRP: <1 mg/dL (ref 0.5–20.0)

## 2018-11-21 LAB — POCT URINE PREGNANCY: Preg Test, Ur: NEGATIVE

## 2018-11-21 LAB — TSH: TSH: 2.48 u[IU]/mL (ref 0.35–4.50)

## 2018-11-25 LAB — RHEUMATOID FACTOR: Rheumatoid fact SerPl-aCnc: <14 IU/mL (ref <14)

## 2018-11-25 LAB — ANA: Anti Nuclear Antibody (ANA): NEGATIVE

## 2018-12-18 ENCOUNTER — Other Ambulatory Visit: Payer: Self-pay | Admitting: Family Medicine

## 2018-12-22 ENCOUNTER — Telehealth: Payer: Self-pay | Admitting: Family Medicine

## 2018-12-22 MED ORDER — LISDEXAMFETAMINE DIMESYLATE 30 MG PO CAPS: 30.0000 mg | ORAL_CAPSULE | Freq: Every day | ORAL | 0 refills | Status: DC

## 2018-12-22 NOTE — Telephone Encounter (Signed)
Medication Refill - Medication: lisdexamfetamine (VYVANSE) 30 MG capsule    Has the patient contacted their pharmacy? Yes.  Pt requested this medication on mychart. She states she was told to stop requesting the medication so early and she waited until she had 3 pills left. Pt states she took her last pill yesterday. Please advise . (Agent: If no, request that the patient contact the pharmacy for the refill.) (Agent: If yes, when and what did the pharmacy advise?)  Preferred Pharmacy (with phone number or street name):  [facility] [place] [place] - [address]  [place] [address]  Phone: [phone] Fax: [phone]  Not a 24 hour pharmacy; exact hours not known.     Agent: Please be advised that RX refills may take up to 3 business days. We ask that you follow-up with your pharmacy.

## 2018-12-22 NOTE — Telephone Encounter (Signed)
Please advise 

## 2018-12-22 NOTE — Telephone Encounter (Signed)
See note

## 2018-12-22 NOTE — Telephone Encounter (Signed)
Rx sent 

## 2018-12-22 NOTE — Telephone Encounter (Signed)
This medication has been sent to the patient's pharmacy.

## 2018-12-22 NOTE — Telephone Encounter (Signed)
Pt calling to check status. Pt would like this called in today.

## 2019-01-18 ENCOUNTER — Other Ambulatory Visit: Payer: Self-pay | Admitting: Family Medicine

## 2019-01-19 MED ORDER — LISDEXAMFETAMINE DIMESYLATE 30 MG PO CAPS: 30.0000 mg | ORAL_CAPSULE | Freq: Every day | ORAL | 0 refills | Status: DC

## 2019-01-19 NOTE — Telephone Encounter (Signed)
Rx request 

## 2019-02-19 ENCOUNTER — Other Ambulatory Visit: Payer: Self-pay | Admitting: Family Medicine

## 2019-02-19 MED ORDER — LISDEXAMFETAMINE DIMESYLATE 30 MG PO CAPS: 30.0000 mg | ORAL_CAPSULE | Freq: Every day | ORAL | 0 refills | Status: DC

## 2019-02-19 NOTE — Telephone Encounter (Signed)
Last OV:  10/20/2018 Next OV:  None Last Fill:  01/19/2019  Please advise.

## 2019-03-05 ENCOUNTER — Telehealth: Payer: Self-pay | Admitting: Obstetrics and Gynecology

## 2019-03-05 NOTE — Telephone Encounter (Signed)
Patient sent the following message through [facility]. Routing to triage to assist patient with request.  Appointment Request From: Candice Camp  With Provider: Arloa Koh, MD [facility]]  Preferred Date Range: 03/09/2019 - 04/01/2019  Preferred Times: Any Time  Reason for visit: Office Visit  Comments: Intense pain during and after intercourse

## 2019-03-06 NOTE — Telephone Encounter (Signed)
Spoke with patient. Patient states she has always experienced pain with intercourse, has become more intense with new partner. Reports menses are irregular, LMP approximately 2 wks ago, has skipped one several months ago. Menses are heavier and increased cramps. Reports thick, white vaginal d/c for one year. Denies vaginal odor, fever/chills, N/V. Denies any pain right now. Stopped Ortho Evra patch awhile back, never restarted. Requesting OV. OV scheduled for 9/9 at 9:30am with Dr. Quincy Simmonds. [id number] precautions reviewed. Advised patient will review with Dr. Quincy Simmonds and return call if any additional recommendations.   Last AEX 08/27/18  Dr. Quincy Simmonds -please review.

## 2019-03-06 NOTE — Telephone Encounter (Signed)
I agree with office visit.  You may close the encounter.  

## 2019-03-11 ENCOUNTER — Encounter: Payer: Self-pay | Admitting: Obstetrics and Gynecology

## 2019-03-11 ENCOUNTER — Other Ambulatory Visit (HOSPITAL_COMMUNITY)
Admission: RE | Admit: 2019-03-11 | Discharge: 2019-03-11 | Disposition: A | Discharge status: Home / Self Care | Payer: Managed Care, Other (non HMO) | Source: Ambulatory Visit | Attending: Obstetrics and Gynecology | Admitting: Obstetrics and Gynecology

## 2019-03-11 ENCOUNTER — Ambulatory Visit (INDEPENDENT_AMBULATORY_CARE_PROVIDER_SITE_OTHER): Payer: Managed Care, Other (non HMO) | Admitting: Obstetrics and Gynecology

## 2019-03-11 ENCOUNTER — Other Ambulatory Visit: Payer: Self-pay

## 2019-03-11 VITALS — BP 104/60 | HR 100 | Temp 97.4°F | Ht 65.0 in | Wt 133.4 lb

## 2019-03-11 DIAGNOSIS — N941 Unspecified dyspareunia: Secondary | ICD-10-CM

## 2019-03-11 DIAGNOSIS — R8290 Unspecified abnormal findings in urine: Secondary | ICD-10-CM

## 2019-03-11 DIAGNOSIS — Z113 Encounter for screening for infections with a predominantly sexual mode of transmission: Secondary | ICD-10-CM | POA: Insufficient documentation

## 2019-03-11 LAB — POCT URINALYSIS DIPSTICK
Bilirubin, UA: NEGATIVE
Blood, UA: NEGATIVE
Glucose, UA: NEGATIVE
Ketones, UA: NEGATIVE
Nitrite, UA: POSITIVE
Protein, UA: NEGATIVE
Urobilinogen, UA: 0.2 E.U./dL
pH, UA: 5 (ref 5.0–8.0)

## 2019-03-11 NOTE — Patient Instructions (Signed)
Endometriosis  Endometriosis is a condition in which the tissue that lines the uterus (endometrium) grows outside of its normal location. The tissue may grow in many locations close to the uterus, but it commonly grows on the ovaries, fallopian tubes, vagina, or bowel. When the uterus sheds the endometrium every menstrual cycle, there is bleeding wherever the endometrial tissue is located. This can cause pain because blood is irritating to tissues that are not normally exposed to it. What are the causes? The cause of endometriosis is not known. What increases the risk? You may be more likely to develop endometriosis if you:  Have a family history of endometriosis.  Have never given birth.  Started your period at age 10 or younger.  Have high levels of estrogen in your body.  Were exposed to a certain medicine (diethylstilbestrol) before you were born (in utero).  Had low birth weight.  Were born as a twin, triplet, or other multiple.  Have a BMI of less than 25. BMI is an estimate of body fat and is calculated from height and weight. What are the signs or symptoms? Often, there are no symptoms of this condition. If you do have symptoms, they may:  Vary depending on where your endometrial tissue is growing.  Occur during your menstrual period (most common) or midcycle.  Come and go, or you may go months with no symptoms at all.  Stop with menopause. Symptoms may include:  Pain in the back or abdomen.  Heavier bleeding during periods.  Pain during sex.  Painful bowel movements.  Infertility.  Pelvic pain.  Bleeding more than once a month. How is this diagnosed? This condition is diagnosed based on your symptoms and a physical exam. You may have tests, such as:  Blood tests and urine tests. These may be done to help rule out other possible causes of your symptoms.  Ultrasound, to look for abnormal tissues.  An X-ray of the lower bowel (barium enema).  An  ultrasound that is done through the vagina (transvaginally).  CT scan.  MRI.  Laparoscopy. In this procedure, a lighted, pencil-sized instrument called a laparoscope is inserted into your abdomen through an incision. The laparoscope allows your health care provider to look at the organs inside your body and check for abnormal tissue to confirm the diagnosis. If abnormal tissue is found, your health care provider may remove a small piece of tissue (biopsy) to be examined under a microscope. How is this treated? Treatment for this condition may include:  Medicines to relieve pain, such as NSAIDs.  Hormone therapy. This involves using artificial (synthetic) hormones to reduce endometrial tissue growth. Your health care provider may recommend using a hormonal form of birth control, or other medicines.  Surgery. This may be done to remove abnormal endometrial tissue. ? In some cases, tissue may be removed using a laparoscope and a laser (laparoscopic laser treatment). ? In severe cases, surgery may be done to remove the fallopian tubes, uterus, and ovaries (hysterectomy). Follow these instructions at home:  Take over-the-counter and prescription medicines only as told by your health care provider.  Do not drive or use heavy machinery while taking prescription pain medicine.  Try to avoid activities that cause pain, including sexual activity.  Keep all follow-up visits as told by your health care provider. This is important. Contact a health care provider if:  You have pain in the area between your hip bones (pelvic area) that occurs: ? Before, during, or after your period. ?   In between your period and gets worse during your period. ? During or after sex. ? With bowel movements or urination, especially during your period.  You have problems getting pregnant.  You have a fever. Get help right away if:  You have severe pain that does not get better with medicine.  You have severe  nausea and vomiting, or you cannot eat without vomiting.  You have pain that affects only the lower, right side of your abdomen.  You have abdominal pain that gets worse.  You have abdominal swelling.  You have blood in your stool. This information is not intended to replace advice given to you by your health care provider. Make sure you discuss any questions you have with your health care provider. Document Released: 06/15/2000 Document Revised: 05/31/2017 Document Reviewed: 11/19/2015 Elsevier Patient Education  2020 Elsevier Inc.  

## 2019-03-11 NOTE — Progress Notes (Signed)
GYNECOLOGY  VISIT   HPI: 23 y.o.   Single  Caucasian  female   G0P0 with Patient's last menstrual period was 02/17/2019 (approximate).   here for pain with intercourse with deep penetration and also has pelvic pain after.   She had a deep sharp pressure deep into her vagina.  This occurred one week ago, and this was different from her usual pain with intercourse was long standing.  This was somewhat positional. A week prior to this, she was sexually active and used a Latex condom, which usually causes burning.  She is noting some discharge but no itching or odor.   Patient went on vacation and she forgot her OrthoEvra.  She stopped the patches on July 13 and has not started them back.  She really liked the [employer], and she did well with this.  The following is taken from my office note for the patient on  08/27/18: "Pelvic US on 09/12/17 for dysmenorrhea and dyspareunia. It was normal other than arcuate uterus.  Normal ovaries.  Took Seasonique for 1 - 1.5 months.  Had emotional changes so she stopped. Did not like NuvaRing.  This was not comfortable.  Wants OrthoEvra. No personal hx of liver disease, DVT/PE, HTN.  Hx of migraines and menstrual migraines.  No aura.   Had labs for irregular menses.  LH > FSH.  Normal TSH and prolactin."  She denies pain with urination.  Hx UTI.   Urine Dip: Pos.Nitrites, 1+WBCs  GYNECOLOGIC HISTORY: Patient's last menstrual period was 02/17/2019 (approximate). Contraception:  Condoms--left OrthoEvra at home when on vacation Menopausal hormone therapy:  n/a Last mammogram:  n/a Last pap smear:   never        OB History    Gravida  0   Para      Term      Preterm      AB      Living        SAB      TAB      Ectopic      Multiple      Live Births                 Patient Active Problem List   Diagnosis Date Noted  . Bipolar disorder ([place]) 04/14/2018  . Cheilitis 12/06/2017  . 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  Dysmenorrhea 08/25/2017  . Dyspareunia in female 08/25/2017  . Irregular menses 08/25/2017  . Anxiety 05/17/2017  . ADHD 04/16/2017  . Migraines 04/16/2017  . Depression, major, single episode, moderate ([place]) 04/16/2017    Past Medical History:  Diagnosis Date  . Anxiety   . Arcuate uterus 2019   noted on pelvic ultrasound  . Depression   . Migraines    without aura    History reviewed. No pertinent surgical history.  Current Outpatient Medications  Medication Sig Dispense Refill  . gabapentin (NEURONTIN) 100 MG capsule TAKE 1 CAPSULE BY MOUTH THREE TIMES A DAY 270 capsule 3  . lisdexamfetamine (VYVANSE) 30 MG capsule Take 1 capsule (30 mg total) by mouth daily. 30 capsule 0  . norelgestromin-ethinyl estradiol (ORTHO EVRA) 150-35 MCG/24HR transdermal patch Place 1 patch onto the skin once a week. (Patient not taking: Reported on 03/11/2019) 3 patch 11   No current facility-administered medications for this visit.      ALLERGIES: Buspar [buspirone]  Family History  Problem Relation Age of Onset  . Hypothyroidism Mother   . Anxiety disorder Mother   . Anxiety disorder  Father   . Cancer Maternal Grandmother        Brain  . Hypertension Paternal Grandmother   . Heart attack Paternal Grandfather     Social History   Socioeconomic History  . Marital status: Single    Spouse name: Not on file  . Number of children: Not on file  . Years of education: Not on file  . Highest education level: Not on file  Occupational History  . Occupation: [employer]  Social Needs  . Financial resource strain: Not on file  . Food insecurity    Worry: Not on file    Inability: Not on file  . Transportation needs    Medical: Not on file    Non-medical: Not on file  Tobacco Use  . Smoking status: Former Smoker    Types: Cigarettes    Quit date: 07/02/2016    Years since quitting: 2.6  . Smokeless tobacco: Never Used  . Tobacco comment: VAPE  Substance and Sexual Activity  . Alcohol  use: Yes    Alcohol/week: 1.0 standard drinks    Types: 1 Glasses of wine per week  . Drug use: No  . Sexual activity: Yes    Partners: Female    Birth control/protection: Condom    Comment: condoms sometimes  Lifestyle  . Physical activity    Days per week: Not on file    Minutes per session: Not on file  . Stress: Not on file  Relationships  . Social Musician on phone: Not on file    Gets together: Not on file    Attends religious service: Not on file    Active member of club or organization: Not on file    Attends meetings of clubs or organizations: Not on file    Relationship status: Not on file  . Intimate partner violence    Fear of current or ex partner: Not on file    Emotionally abused: Not on file    Physically abused: Not on file    Forced sexual activity: Not on file  Other Topics Concern  . Not on file  Social History Narrative  . Not on file    Review of Systems  All other systems reviewed and are negative.   PHYSICAL EXAMINATION:    BP 104/60   Pulse 100   Temp (!) 97.4 F (36.3 C) (Temporal)   Ht 5\' 5"  (1.651 m)   Wt 133 lb 6.4 oz (60.5 kg)   LMP 02/17/2019 (Approximate)   BMI 22.20 kg/m     General appearance: alert, cooperative and appears stated age   Pelvic: External genitalia:  no lesions              Urethra:  normal appearing urethra with no masses, tenderness or lesions              Bartholins and Skenes: normal                 Vagina: normal appearing vagina with normal color and discharge, no lesions              Cervix: no lesions                Bimanual Exam:  Uterus:  normal size, contour, position, consistency, mobility, non-tender              Adnexa: no mass, fullness, tenderness          Chaperone was present for exam.  ASSESSMENT  Dyspareunia. Abnormal urine.  Painful morning urination, after voiding.  Hx bipolar disorder.   PLAN  We discussed dysmenorrhea and dyspareunia.  We reviewed endometriosis as  well - signs, symptoms, effect on fertility, diagnosis, treatment options including medical and surgical.  I do not recommend [place] due to her hx bipolar.  She will restart her Ortho Evra with her next menstruation.  Urine micro and culture.  GC/CT/trich testing.  She will do serum STD screening at another time.    An After Visit Summary was printed and given to the patient.  ___25___ minutes face to face time of which over 50% was spent in counseling.

## 2019-03-12 LAB — URINALYSIS, MICROSCOPIC ONLY
Bacteria, UA: NONE SEEN
Casts: NONE SEEN /lpf

## 2019-03-13 LAB — CERVICOVAGINAL ANCILLARY ONLY
Chlamydia: NEGATIVE
Neisseria Gonorrhea: NEGATIVE
Trichomonas: NEGATIVE

## 2019-03-14 LAB — URINE CULTURE

## 2019-03-15 MED ORDER — SULFAMETHOXAZOLE-TRIMETHOPRIM 800-160 MG PO TABS: 1.0000 | ORAL_TABLET | Freq: Two times a day (BID) | ORAL | 0 refills | Status: DC

## 2019-03-15 NOTE — Addendum Note (Signed)
Addended by: Yisroel Ramming, Dietrich Pates E on: 03/15/2019 08:41 AM   Modules accepted: Orders

## 2019-03-21 ENCOUNTER — Other Ambulatory Visit: Payer: Self-pay | Admitting: Family Medicine

## 2019-03-23 ENCOUNTER — Other Ambulatory Visit: Payer: Managed Care, Other (non HMO)

## 2019-03-23 MED ORDER — LISDEXAMFETAMINE DIMESYLATE 30 MG PO CAPS: 30.0000 mg | ORAL_CAPSULE | Freq: Every day | ORAL | 0 refills | Status: DC

## 2019-03-23 NOTE — Telephone Encounter (Signed)
Rx sent in. Please ask pt to schedule 6 month f/u soon.

## 2019-03-23 NOTE — Telephone Encounter (Signed)
Rx Request 

## 2019-04-02 ENCOUNTER — Ambulatory Visit (INDEPENDENT_AMBULATORY_CARE_PROVIDER_SITE_OTHER): Payer: Managed Care, Other (non HMO) | Admitting: Family Medicine

## 2019-04-02 DIAGNOSIS — F909 Attention-deficit hyperactivity disorder, unspecified type: Secondary | ICD-10-CM | POA: Diagnosis not present

## 2019-04-02 DIAGNOSIS — F319 Bipolar disorder, unspecified: Secondary | ICD-10-CM | POA: Diagnosis not present

## 2019-04-02 NOTE — Assessment & Plan Note (Signed)
Stable.  Continue gabapentin 100 mg 3 times daily. 

## 2019-04-02 NOTE — Progress Notes (Signed)
    Chief Complaint:  Felicia Ellison is a 23 y.o. female who presents today for a virtual office visit with a chief complaint of ADHD follow up.   Assessment/Plan:  Bipolar disorder ([place]) Stable.  Continue gabapentin 100mg   3 times daily.  ADHD Database with no red flags.  Continue Vyvanse 30 mg daily.    Subjective:  HPI:  Her stable, chronic medical conditions are outlined below:  # ADHD - On vyvanse 30mg  daily and tolerating well without side effects. -Medications help with her ability to focus and stay on task at work. -ROS: No reported palpitations or chest pain  # Bipolar Disorder - On gabapentin 100mg  three times daily and tolerating well - ROS: No SI or HI.   ROS: Per HPI  PMH: She reports that she quit smoking about 2 years ago. Her smoking use included cigarettes. She has never used smokeless tobacco. She reports current alcohol use of about 1.0 standard drinks of alcohol per week. She reports that she does not use drugs.      Objective/Observations  Physical Exam: Gen: NAD, resting comfortably Pulm: Normal work of breathing Neuro: Grossly normal, moves all extremities Psych: Normal affect and thought content  Virtual Visit via Video   I connected with Felicia Ellison on 04/02/19 at 11:00 AM EDT by a video enabled telemedicine application and verified that I am speaking with the correct person using two identifiers. I discussed the limitations of evaluation and management by telemedicine and the availability of in person appointments. The patient expressed understanding and agreed to proceed.   Patient location: Home Provider location: [place] participating in the virtual visit: Myself and Patient     Algis Greenhouse. Jerline Pain, MD 04/02/2019 10:10 AM

## 2019-04-02 NOTE — Assessment & Plan Note (Signed)
Database with no red flags.  Continue Vyvanse 30 mg daily.

## 2019-04-20 ENCOUNTER — Other Ambulatory Visit: Payer: Self-pay | Admitting: Family Medicine

## 2019-04-21 MED ORDER — LISDEXAMFETAMINE DIMESYLATE 30 MG PO CAPS: 30.0000 mg | ORAL_CAPSULE | Freq: Every day | ORAL | 0 refills | Status: DC

## 2019-04-21 NOTE — Telephone Encounter (Signed)
Please advise 

## 2019-05-20 ENCOUNTER — Other Ambulatory Visit: Payer: Self-pay | Admitting: Family Medicine

## 2019-05-20 MED ORDER — LISDEXAMFETAMINE DIMESYLATE 30 MG PO CAPS: 30.0000 mg | ORAL_CAPSULE | Freq: Every day | ORAL | 0 refills | Status: DC

## 2019-05-20 NOTE — Telephone Encounter (Signed)
Please advise 

## 2019-05-25 ENCOUNTER — Other Ambulatory Visit: Payer: Self-pay

## 2019-05-25 MED ORDER — NORELGESTROMIN-ETH ESTRADIOL 150-35 MCG/24HR TD PTWK: 1.0000 | MEDICATED_PATCH | TRANSDERMAL | 4 refills | Status: DC

## 2019-05-25 NOTE — Telephone Encounter (Signed)
Medication refill request: Xulane Patch Last AEX:  08/27/18 BS Next AEX: 09/02/19 Last MMG (if hormonal medication request): n/a Refill authorized: Please advise; Order pended #3 w/5 refills if authorized

## 2019-06-20 ENCOUNTER — Other Ambulatory Visit: Payer: Self-pay | Admitting: Family Medicine

## 2019-06-22 MED ORDER — LISDEXAMFETAMINE DIMESYLATE 30 MG PO CAPS: 30.0000 mg | ORAL_CAPSULE | Freq: Every day | ORAL | 0 refills | Status: DC

## 2019-06-22 NOTE — Telephone Encounter (Signed)
Rx request 

## 2019-07-20 ENCOUNTER — Other Ambulatory Visit: Payer: Self-pay | Admitting: Family Medicine

## 2019-07-20 ENCOUNTER — Ambulatory Visit (INDEPENDENT_AMBULATORY_CARE_PROVIDER_SITE_OTHER): Payer: Managed Care, Other (non HMO) | Admitting: Family Medicine

## 2019-07-20 ENCOUNTER — Other Ambulatory Visit: Payer: Self-pay

## 2019-07-20 DIAGNOSIS — F909 Attention-deficit hyperactivity disorder, unspecified type: Secondary | ICD-10-CM | POA: Diagnosis not present

## 2019-07-20 DIAGNOSIS — F419 Anxiety disorder, unspecified: Secondary | ICD-10-CM

## 2019-07-20 MED ORDER — AZITHROMYCIN 250 MG PO TABS: ORAL_TABLET | ORAL | 0 refills | Status: DC

## 2019-07-20 MED ORDER — LISDEXAMFETAMINE DIMESYLATE 30 MG PO CAPS: 30.0000 mg | ORAL_CAPSULE | Freq: Every day | ORAL | 0 refills | Status: DC

## 2019-07-20 MED ORDER — AZELASTINE HCL 0.1 % NA SOLN: 2.0000 | Freq: Two times a day (BID) | NASAL | 12 refills | Status: DC

## 2019-07-20 NOTE — Assessment & Plan Note (Signed)
Stable.  Continue gabapentin 100 mg 3 times daily. 

## 2019-07-20 NOTE — Assessment & Plan Note (Signed)
Stable.  Recently started online school.  Will refill Vyvanse 30 mg daily.

## 2019-07-20 NOTE — Progress Notes (Signed)
   Felicia Ellison is a 24 y.o. female who presents today for a virtual office visit.  Assessment/Plan:  New/Acute Problems: Sinusitis  No red flags.  Will start Astelin nasal spray.  Will send in "pocket prescription" for azithromycin with instruction not start unless symptoms worsen or not improve the next 2 days.  Continue good oral hydration.  She can also continue using over-the-counter meds as needed.  Chronic Problems Addressed Today: Anxiety Stable.  Continue gabapentin 100 mg 3 times daily.  ADHD Stable.  Recently started online school.  Will refill Vyvanse 30 mg daily.     Subjective:  HPI:  Sinusitis  Symptoms started a day ago.  She has been having a lot of rhinorrhea and watery eyes.  Feels similar to past sinus infections.  Has tried Sudafed which not work.  Has tried other over-the-counter meds which have not worked either.  No fever or chills.  Patient is not worried about COVID-19 infection.        Objective/Observations  Physical Exam: Gen: NAD, resting comfortably Pulm: Normal work of breathing Neuro: Grossly normal, moves all extremities Psych: Normal affect and thought content  Virtual Visit via Video   I connected with Felicia Ellison on 07/20/19 at  3:00 PM EST by a video enabled telemedicine application and verified that I am speaking with the correct person using two identifiers. The limitations of evaluation and management by telemedicine and the availability of in person appointments were discussed. The patient expressed understanding and agreed to proceed.   Patient location: Home Provider location: [place] [employer] Persons participating in the virtual visit: Myself and Patient     Katina Degree. Jimmey Ralph, MD 07/20/2019 2:56 PM

## 2019-08-13 ENCOUNTER — Telehealth: Payer: Self-pay | Admitting: Family Medicine

## 2019-08-13 NOTE — Telephone Encounter (Signed)
Document

## 2019-08-13 NOTE — Telephone Encounter (Signed)
Pt called stating she accidentally took her vyvanse 5 hours a part. Pt was concerned it would harm her.  Dosage was 30 MG. Please advise.

## 2019-08-13 NOTE — Telephone Encounter (Signed)
Noted. Agree with plan.  Katina Degree. Jimmey Ralph, MD 08/13/2019 4:16 PM

## 2019-08-13 NOTE — Telephone Encounter (Signed)
At the front of office at the time of call, pt took an early dose of Vyvanse within 5 hours. She called to ask if she would be okay. I advised that she would be fine, and to monitor palpitations if she begins to have any. I also advised her to take medications as prescribed. And to follow up on any questions or concerns.

## 2019-08-17 ENCOUNTER — Other Ambulatory Visit: Payer: Self-pay | Admitting: Family Medicine

## 2019-08-17 NOTE — Telephone Encounter (Signed)
Rx Request 

## 2019-08-18 MED ORDER — LISDEXAMFETAMINE DIMESYLATE 30 MG PO CAPS: 30.0000 mg | ORAL_CAPSULE | Freq: Every day | ORAL | 0 refills | Status: DC

## 2019-08-23 ENCOUNTER — Other Ambulatory Visit: Payer: Self-pay | Admitting: Obstetrics and Gynecology

## 2019-09-01 ENCOUNTER — Other Ambulatory Visit: Payer: Self-pay

## 2019-09-02 ENCOUNTER — Ambulatory Visit: Payer: Managed Care, Other (non HMO) | Admitting: Obstetrics and Gynecology

## 2019-09-18 ENCOUNTER — Other Ambulatory Visit: Payer: Self-pay | Admitting: Family Medicine

## 2019-09-18 MED ORDER — LISDEXAMFETAMINE DIMESYLATE 30 MG PO CAPS: 30.0000 mg | ORAL_CAPSULE | Freq: Every day | ORAL | 0 refills | Status: DC

## 2019-09-18 NOTE — Telephone Encounter (Signed)
LAST APPOINTMENT DATE: 07/20/2019  NEXT APPOINTMENT DATE:@Visit  date not found  Rx Vyvanse  LAST REFILL:08/18/2019  QTY:30 0Rf

## 2019-10-11 ENCOUNTER — Other Ambulatory Visit: Payer: Self-pay | Admitting: Family Medicine

## 2019-10-19 ENCOUNTER — Other Ambulatory Visit: Payer: Self-pay | Admitting: Family Medicine

## 2019-10-19 MED ORDER — LISDEXAMFETAMINE DIMESYLATE 30 MG PO CAPS: 30.0000 mg | ORAL_CAPSULE | Freq: Every day | ORAL | 0 refills | Status: DC

## 2019-10-19 NOTE — Telephone Encounter (Signed)
LAST APPOINTMENT DATE: 07/19/2018  NEXT APPOINTMENT DATE:@Visit  date not found  Rx Vyvanse  LAST REFILL: 09/18/2019  QTY:30 0Rf

## 2019-10-30 ENCOUNTER — Other Ambulatory Visit: Payer: Self-pay | Admitting: Obstetrics and Gynecology

## 2019-11-16 ENCOUNTER — Other Ambulatory Visit: Payer: Self-pay | Admitting: Family Medicine

## 2019-11-16 MED ORDER — LISDEXAMFETAMINE DIMESYLATE 30 MG PO CAPS: 30.0000 mg | ORAL_CAPSULE | Freq: Every day | ORAL | 0 refills | Status: DC

## 2019-11-16 NOTE — Telephone Encounter (Signed)
Pt requesting Vyvanse 30mg  capsule  LOV: 07/20/2019 Next appointment: Not scheduled   Last refill; 10/19/2019  Approve?

## 2019-12-14 ENCOUNTER — Other Ambulatory Visit: Payer: Self-pay | Admitting: Family Medicine

## 2019-12-14 MED ORDER — LISDEXAMFETAMINE DIMESYLATE 30 MG PO CAPS: 30.0000 mg | ORAL_CAPSULE | Freq: Every day | ORAL | 0 refills | Status: DC

## 2019-12-14 NOTE — Telephone Encounter (Signed)
Rx Request 

## 2020-01-08 ENCOUNTER — Telehealth (INDEPENDENT_AMBULATORY_CARE_PROVIDER_SITE_OTHER): Payer: Managed Care, Other (non HMO) | Admitting: Family Medicine

## 2020-01-08 DIAGNOSIS — F419 Anxiety disorder, unspecified: Secondary | ICD-10-CM | POA: Diagnosis not present

## 2020-01-08 DIAGNOSIS — F909 Attention-deficit hyperactivity disorder, unspecified type: Secondary | ICD-10-CM

## 2020-01-08 MED ORDER — LISDEXAMFETAMINE DIMESYLATE 30 MG PO CAPS: 30.0000 mg | ORAL_CAPSULE | Freq: Every day | ORAL | 0 refills | Status: DC

## 2020-01-08 MED ORDER — NORELGESTROMIN-ETH ESTRADIOL 150-35 MCG/24HR TD PTWK: 1.0000 | MEDICATED_PATCH | TRANSDERMAL | 4 refills | Status: DC

## 2020-01-08 NOTE — Progress Notes (Signed)
   Felicia Ellison is a 24 y.o. female who presents today for a virtual office visit.  Assessment/Plan:  Chronic Problems Addressed Today: Anxiety Stable.  Continue gabapentin 100 mg 3 times daily.  ADHD Stable.  Database with no red flags.  Medications help with ability to stay focused and on task.  Will refill Vyvanse 30 mg daily.  She will be going out of town.  Will give 1 early refill 5 days early.  She will follow-up in 6 months.    Subjective:  HPI:  See A/p.         Objective/Observations  Physical Exam: Gen: NAD, resting comfortably Pulm: Normal work of breathing Neuro: Grossly normal, moves all extremities Psych: Normal affect and thought content  Virtual Visit via Video   I connected with Felicia Ellison on 01/08/20 at  3:40 PM EDT by a video enabled telemedicine application and verified that I am speaking with the correct person using two identifiers. The limitations of evaluation and management by telemedicine and the availability of in person appointments were discussed. The patient expressed understanding and agreed to proceed.   Patient location: Home Provider location: [place] [employer] Persons participating in the virtual visit: Myself and Patient     Katina Degree. Jimmey Ralph, MD 01/08/2020 3:51 PM

## 2020-01-08 NOTE — Assessment & Plan Note (Signed)
Stable.  Continue gabapentin 100 mg 3 times daily.

## 2020-01-08 NOTE — Assessment & Plan Note (Signed)
Stable.  Database with no red flags.  Medications help with ability to stay focused and on task.  Will refill Vyvanse 30 mg daily.  She will be going out of town.  Will give 1 early refill 5 days early.  She will follow-up in 6 months.

## 2020-01-22 IMAGING — DX LUMBAR SPINE - COMPLETE 4+ VIEW
5 series · 5 of 5 positions shown · non-contrast
Comparison: None.

CLINICAL DATA: Acute bilateral low back pain without sciatica.

EXAM:
LUMBAR SPINE - COMPLETE 4+ VIEW

[lumbar spine ap]
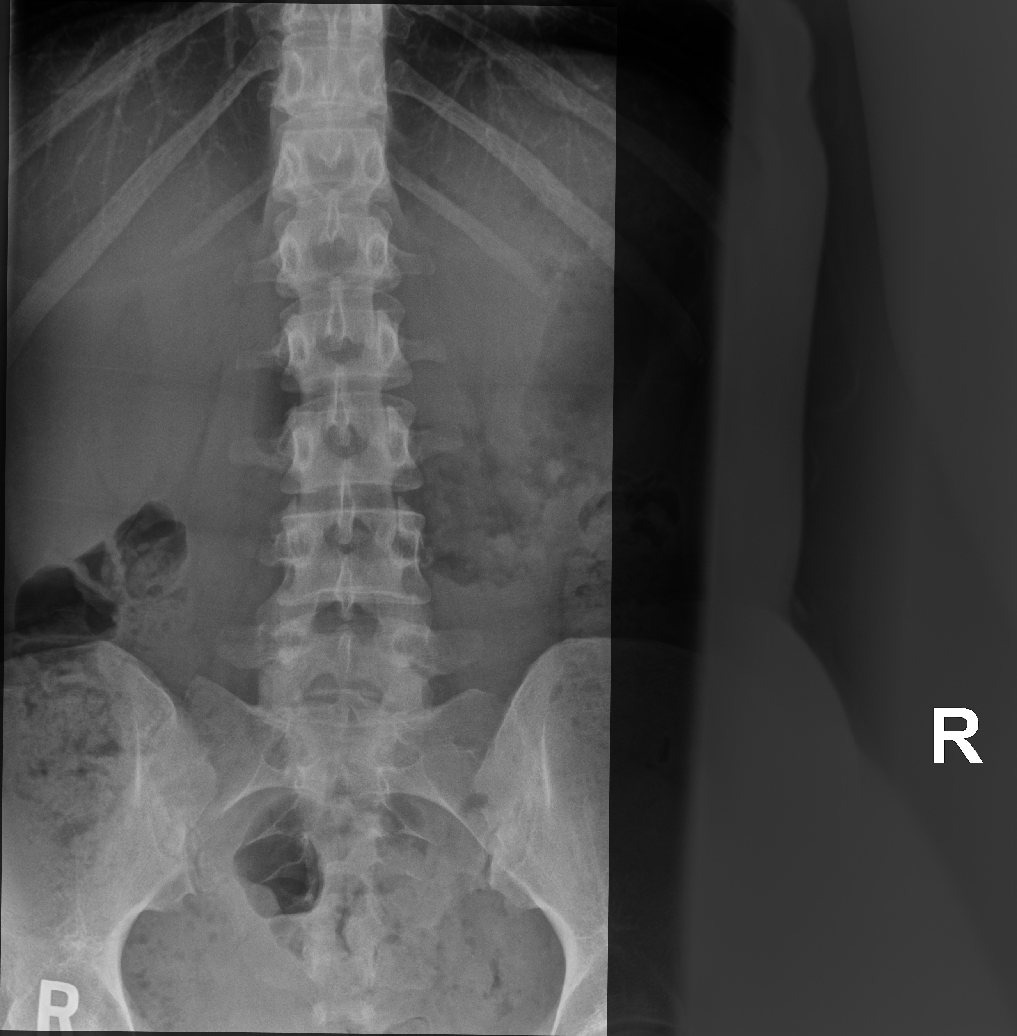

[lumbar spine oblique (1 of 2)]
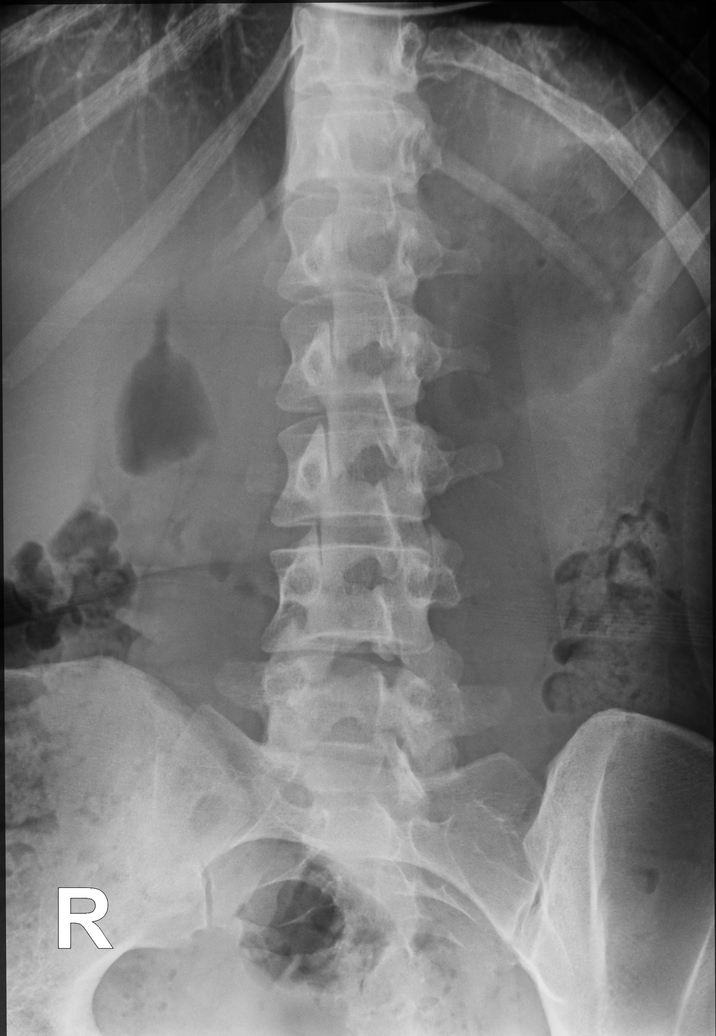

[lumbar spine oblique (2 of 2)]
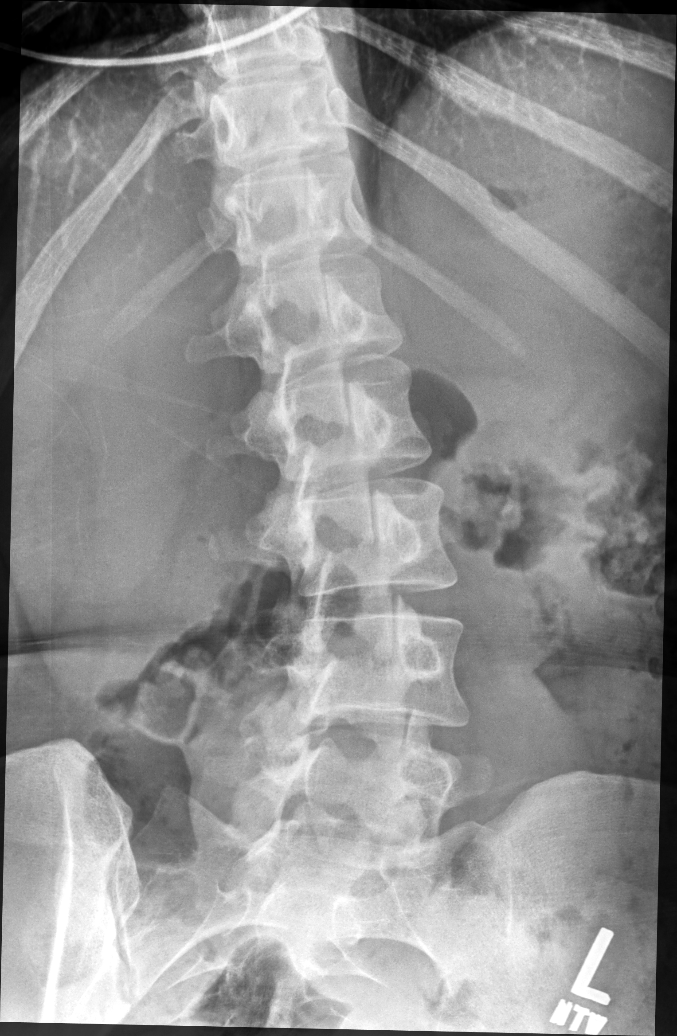

[lumbar spine lat (1 of 2)]
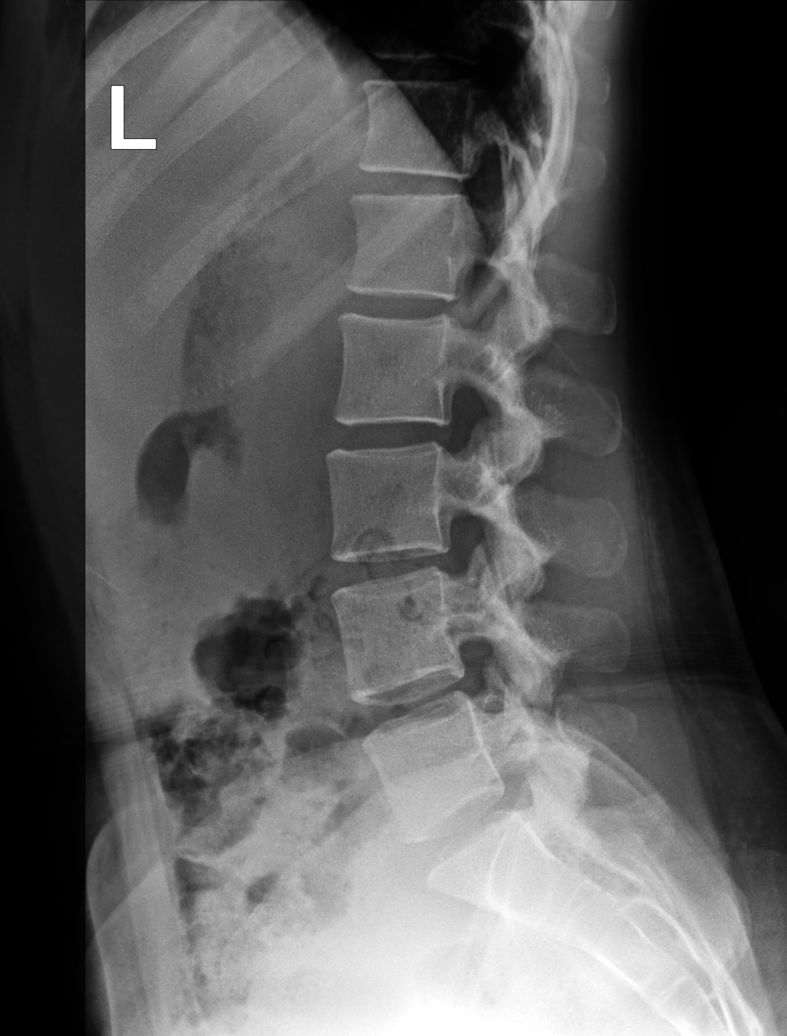

[lumbar spine lat (2 of 2)]
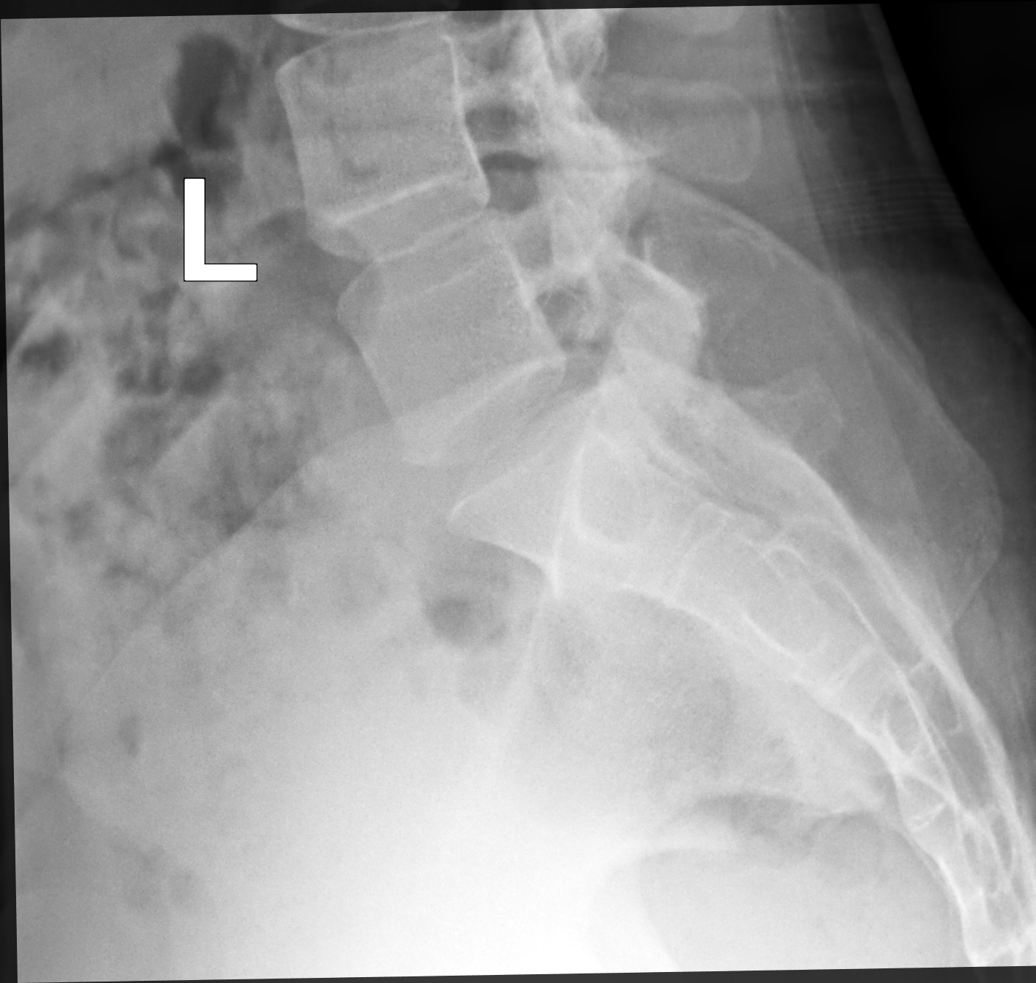

[5 of 5 positions shown; findings below may reference images not displayed]

FINDINGS: There is no evidence of lumbar spine fracture. Alignment is normal.
Intervertebral disc spaces are maintained.
IMPRESSION: Negative.

## 2020-02-12 ENCOUNTER — Other Ambulatory Visit: Payer: Self-pay | Admitting: Family Medicine

## 2020-02-12 MED ORDER — LISDEXAMFETAMINE DIMESYLATE 30 MG PO CAPS: 30.0000 mg | ORAL_CAPSULE | Freq: Every day | ORAL | 0 refills | Status: DC

## 2020-02-12 NOTE — Telephone Encounter (Signed)
LR: 01-08-2020 Qty: 30 w 0 refills  Last office visit: 01-08-2020 Upcoming appointment: No pending appt

## 2020-03-14 ENCOUNTER — Other Ambulatory Visit: Payer: Self-pay | Admitting: Family Medicine

## 2020-03-14 MED ORDER — LISDEXAMFETAMINE DIMESYLATE 30 MG PO CAPS: 30.0000 mg | ORAL_CAPSULE | Freq: Every day | ORAL | 0 refills | Status: DC

## 2020-03-14 NOTE — Telephone Encounter (Signed)
Last refill: 02/12/20 #30, 0 Last OV:  01/08/20 dx. ADHD

## 2020-03-25 ENCOUNTER — Encounter: Payer: Self-pay | Admitting: Physician Assistant

## 2020-03-25 ENCOUNTER — Telehealth (INDEPENDENT_AMBULATORY_CARE_PROVIDER_SITE_OTHER): Payer: Managed Care, Other (non HMO) | Admitting: Physician Assistant

## 2020-03-25 ENCOUNTER — Telehealth: Payer: Self-pay

## 2020-03-25 VITALS — Ht 65.0 in | Wt 135.0 lb

## 2020-03-25 DIAGNOSIS — J029 Acute pharyngitis, unspecified: Secondary | ICD-10-CM

## 2020-03-25 MED ORDER — PREDNISONE 20 MG PO TABS: 40.0000 mg | ORAL_TABLET | Freq: Every day | ORAL | 0 refills | Status: AC

## 2020-03-25 MED ORDER — AMOXICILLIN 500 MG PO CAPS: 500.0000 mg | ORAL_CAPSULE | Freq: Two times a day (BID) | ORAL | 0 refills | Status: AC

## 2020-03-25 NOTE — Telephone Encounter (Signed)
Pt states she has had red swollen throat, with a lots of mucus for 3 days. 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

## 2020-03-25 NOTE — Progress Notes (Signed)
Virtual Visit via Video   I connected with Felicia Ellison on 03/25/20 at 10:00 AM EDT by a video enabled telemedicine application and verified that I am speaking with the correct person using two identifiers. Location patient: Home Location provider: [facility], Office Persons participating in the virtual visit: Leslye Puccini, Jarold Motto PA-C, Corky Mull, LPN   I discussed the limitations of evaluation and management by telemedicine and the availability of in person appointments. The patient expressed understanding and agreed to proceed.  I acted as a Neurosurgeon for [employer], PA-C [employer], LPN   Subjective:   HPI:   Patient is requesting evaluation for URI symptoms.  Symptom onset: 3 days ago  Travel/contacts: NO exposure or travel  Vaccination status: Yes  Patient endorses the following symptoms: ear pain, sore throat and dry cough (non-productive), throat is red  Patient denies the following symptoms: Fever (none), sinus headache, sinus congestion, rhinorrhea, itchy watery eyes, difficulty swallowing, wheezing, shortness of breath, chest tightness and chest pain  Treatments tried: Antihistamines, ibuprofen  Patient risk factors: Current COVID-19 risk of complications score: 0 Smoking status: Khristi Schiller  reports that she quit smoking about 3 years ago. Her smoking use included cigarettes. She has never used smokeless tobacco. If female, currently pregnant? []   Yes [x]   No  ROS: See pertinent positives and negatives per HPI.  Patient Active Problem List   Diagnosis Date Noted  . Bipolar disorder (HCC) 04/14/2018  . 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 Dysmenorrhea 08/25/2017  . Dyspareunia in female 08/25/2017  . Irregular menses 08/25/2017  . Anxiety 05/17/2017  . ADHD 04/16/2017  . Migraines 04/16/2017  . Depression, major, single episode, moderate (HCC) 04/16/2017    Social History   Tobacco Use  . Smoking status: Former Smoker     Types: Cigarettes    Quit date: 07/02/2016    Years since quitting: 3.7  . Smokeless tobacco: Never Used  . Tobacco comment: VAPE  Substance Use Topics  . Alcohol use: Yes    Alcohol/week: 1.0 standard drink    Types: 1 Glasses of wine per week    Current Outpatient Medications:  .  gabapentin (NEURONTIN) 100 MG capsule, TAKE 1 CAPSULE BY MOUTH THREE TIMES A DAY, Disp: 270 capsule, Rfl: 3 .  lisdexamfetamine (VYVANSE) 30 MG capsule, Take 1 capsule (30 mg total) by mouth daily., Disp: 30 capsule, Rfl: 0 .  norelgestromin-ethinyl estradiol (ORTHO EVRA) 150-35 MCG/24HR transdermal patch, Place 1 patch onto the skin once a week., Disp: 3 patch, Rfl: 4 .  amoxicillin (AMOXIL) 500 MG capsule, Take 1 capsule (500 mg total) by mouth 2 (two) times daily., Disp: 20 capsule, Rfl: 0 .  predniSONE (DELTASONE) 20 MG tablet, Take 2 tablets (40 mg total) by mouth daily., Disp: 10 tablet, Rfl: 0  Allergies  Allergen Reactions  . Buspar [Buspirone] Other (See Comments)    *Dizziness with Nausea     Objective:   VITALS: Per patient if applicable, see vitals. GENERAL: Alert, appears well and in no acute distress. HEENT: Atraumatic, conjunctiva clear, no obvious abnormalities on inspection of external nose and ears. NECK: Normal movements of the head and neck. CARDIOPULMONARY: No increased WOB. Speaking in clear sentences. I:E ratio WNL.  MS: Moves all visible extremities without noticeable abnormality. PSYCH: Pleasant and cooperative, well-groomed. Speech normal rate and rhythm. Affect is appropriate. Insight and judgement are appropriate. Attention is focused, linear, and appropriate.  NEURO: CN grossly intact. Oriented as arrived to appointment on time with  no prompting. Moves both UE equally.  SKIN: No obvious lesions, wounds, erythema, or cyanosis noted on face or hands.  Assessment and Plan:   Jazmyn was seen today for covid symptoms.  Diagnoses and all orders for this visit:  Pharyngitis,  unspecified etiology No red flags on discussion, patient is not in any obvious distress during our visit. Discussed options for further work-up including drive-up parking lot strep and covid test. She declines this today. Will empirically treat for strep with amoxicillin. She also is asking for help with throat pain, she is currently taking ibuprofen without relief. Will trial oral prednisone for inflammation. Discussed over the counter supportive care options, with recommendations to push fluids and rest. Reviewed return precautions including new/worsening fever, SOB, new/worsening cough or other concerns.  Recommended need to self-quarantine and practice social distancing until symptoms resolve. Discussed current recommendations for COVID testing. I recommend that patient follow-up if symptoms worsen or persist despite treatment x 7-10 days, sooner if needed.     Other orders -     amoxicillin (AMOXIL) 500 MG capsule; Take 1 capsule (500 mg total) by mouth 2 (two) times daily. -     predniSONE (DELTASONE) 20 MG tablet; Take 2 tablets (40 mg total) by mouth daily.      . Reviewed expectations re: course of current medical issues. . Discussed self-management of symptoms. . Outlined signs and symptoms indicating need for more acute intervention. . Patient verbalized understanding and all questions were answered. Marland Kitchen Health Maintenance issues including appropriate healthy diet, exercise, and smoking avoidance were discussed with patient. . See orders for this visit as documented in the electronic medical record.  I discussed the assessment and treatment plan with the patient. The patient was provided an opportunity to ask questions and all were answered. The patient agreed with the plan and demonstrated an understanding of the instructions.   The patient was advised to call back or seek an in-person evaluation if the symptoms worsen or if the condition fails to improve as anticipated.     CMA or LPN served as scribe during this visit. History, Physical, and Plan performed by medical provider. The above documentation has been reviewed and is accurate and complete.  [place], [place] 03/25/2020

## 2020-03-25 NOTE — Telephone Encounter (Signed)
Pt scheduled with Sam today

## 2020-03-28 ENCOUNTER — Encounter: Payer: Self-pay | Admitting: Physician Assistant

## 2020-03-28 DIAGNOSIS — Z713 Dietary counseling and surveillance: Secondary | ICD-10-CM

## 2020-03-29 ENCOUNTER — Encounter: Payer: Self-pay | Admitting: *Deleted

## 2020-03-29 ENCOUNTER — Ambulatory Visit: Payer: Managed Care, Other (non HMO) | Admitting: Physician Assistant

## 2020-04-04 ENCOUNTER — Telehealth: Payer: Self-pay

## 2020-04-04 NOTE — Telephone Encounter (Signed)
Looks like Sam put in the referral - can we call patient and see what the reasons she wanted to see a nutritionist was for?  Katina Degree. Jimmey Ralph, MD 04/04/2020 12:57 PM

## 2020-04-04 NOTE — Telephone Encounter (Signed)
See below

## 2020-04-04 NOTE — Telephone Encounter (Signed)
Called and lm for pt tcb. 

## 2020-04-04 NOTE — Telephone Encounter (Signed)
Melissa from [facility] called regarding pt referral. They are not able to use Z dx codes. Melissa states the code is too broad. They need a different dx code that is more specific and explanation in the comments as to why the patient needs to be seen. Please advise.

## 2020-04-07 ENCOUNTER — Telehealth: Payer: Self-pay | Admitting: *Deleted

## 2020-04-07 NOTE — Telephone Encounter (Signed)
Spoke to pt calling to see why she wanted to see the Nutritionist? They are needing a diagnosis. Pt said she does not have a diagnosis just that she is not eating. Pt said she has an appt on Monday with her Psychiatrist to discuss. Asked pt if she is vomiting after eating when she does eat? Pt said no. Tod her okay.

## 2020-04-11 ENCOUNTER — Other Ambulatory Visit: Payer: Self-pay | Admitting: Family Medicine

## 2020-04-11 NOTE — Telephone Encounter (Signed)
LR: 03-14-2020 Qty: 30 with 0 refills  Last office visit: 01-08-2020 (video visit) Upcoming appointment: No pending appt

## 2020-04-12 MED ORDER — LISDEXAMFETAMINE DIMESYLATE 30 MG PO CAPS
30.0000 mg | ORAL_CAPSULE | Freq: Every day | ORAL | 0 refills | Status: DC
Start: 2020-04-12 — End: 2020-05-10

## 2020-05-10 ENCOUNTER — Other Ambulatory Visit: Payer: Self-pay | Admitting: Family Medicine

## 2020-05-10 MED ORDER — LISDEXAMFETAMINE DIMESYLATE 30 MG PO CAPS
30.0000 mg | ORAL_CAPSULE | Freq: Every day | ORAL | 0 refills | Status: DC
Start: 2020-05-10 — End: 2020-07-31

## 2020-05-31 ENCOUNTER — Other Ambulatory Visit: Payer: Self-pay | Admitting: Family Medicine

## 2020-05-31 NOTE — Telephone Encounter (Signed)
Requesting refills Not in patient medication list  Please advise

## 2020-07-31 ENCOUNTER — Other Ambulatory Visit: Payer: Self-pay | Admitting: Family Medicine

## 2020-08-01 MED ORDER — LISDEXAMFETAMINE DIMESYLATE 30 MG PO CAPS: 30.0000 mg | ORAL_CAPSULE | Freq: Every day | ORAL | 0 refills | Status: AC

## 2020-08-01 NOTE — Telephone Encounter (Signed)
LAST APPOINTMENT DATE: 03/25/2020   NEXT APPOINTMENT DATE: Visit date not found    LAST REFILL: 05/10/2020  QTY: 30

## 2020-09-16 ENCOUNTER — Encounter: Payer: Self-pay | Admitting: Physician Assistant

## 2020-09-19 NOTE — Telephone Encounter (Signed)
Please advise if can change to virtual or if pt still needs to come in?

## 2020-09-20 ENCOUNTER — Ambulatory Visit: Payer: Managed Care, Other (non HMO) | Admitting: Physician Assistant

## 2020-09-20 DIAGNOSIS — Z0289 Encounter for other administrative examinations: Secondary | ICD-10-CM

## 2020-09-20 NOTE — Progress Notes (Deleted)
Felicia Ellison is a 25 y.o. female here for a follow up of a pre-existing problem.  I acted as a Neurosurgeon for [employer], PA-C [employer], LPN   History of Present Illness:   No chief complaint on file.   HPI Vaginal discharge     Past Medical History:  Diagnosis Date  . Anxiety   . Arcuate uterus 2019   noted on pelvic ultrasound  . Depression   . Migraines    without aura     Social History   Tobacco Use  . Smoking status: Former Smoker    Types: Cigarettes    Quit date: 07/02/2016    Years since quitting: 4.2  . Smokeless tobacco: Never Used  . Tobacco comment: VAPE  Vaping Use  . Vaping Use: Every day  . Substances: Nicotine  Substance Use Topics  . Alcohol use: Yes    Alcohol/week: 1.0 standard drink    Types: 1 Glasses of wine per week  . Drug use: No    No past surgical history on file.  Family History  Problem Relation Age of Onset  . Hypothyroidism Mother   . Anxiety disorder Mother   . Anxiety disorder Father   . Cancer Maternal Grandmother        Brain  . Hypertension Paternal Grandmother   . Heart attack Paternal Grandfather     Allergies  Allergen Reactions  . Buspar [Buspirone] Other (See Comments)    *Dizziness with Nausea     Current Medications:   Current Outpatient Medications:  .  amoxicillin (AMOXIL) 500 MG capsule, Take 1 capsule (500 mg total) by mouth 2 (two) times daily., Disp: 20 capsule, Rfl: 0 .  gabapentin (NEURONTIN) 100 MG capsule, TAKE 1 CAPSULE BY MOUTH THREE TIMES A DAY, Disp: 270 capsule, Rfl: 3 .  lisdexamfetamine (VYVANSE) 30 MG capsule, Take 1 capsule (30 mg total) by mouth daily., Disp: 30 capsule, Rfl: 0 .  predniSONE (DELTASONE) 20 MG tablet, Take 2 tablets (40 mg total) by mouth daily., Disp: 10 tablet, Rfl: 0 .  XULANE 150-35 MCG/24HR transdermal patch, APPLY 1 PATCH ONCE A WEEK, Disp: 3 patch, Rfl: 11   Review of Systems:   ROS  Vitals:   There were no vitals filed for this visit.   There  is no height or weight on file to calculate BMI.  Physical Exam:   Physical Exam  Results for orders placed or performed in visit on 03/11/19  Urine Culture   Specimen: Urine   UC  Result Value Ref Range   Urine Culture, Routine Final report (A)    Organism ID, Bacteria Escherichia coli (A)    Antimicrobial Susceptibility Comment   Urine Microscopic  Result Value Ref Range   WBC, UA 0-5 0 - 5 /hpf   RBC 0-2 0 - 2 /hpf   Epithelial Cells (non renal) 0-10 0 - 10 /hpf   Casts None seen None seen /lpf   Mucus, UA Present Not Estab.   Bacteria, UA None seen None seen/Few  POCT urinalysis dipstick  Result Value Ref Range   Color, UA yellow    Clarity, UA cloudy    Glucose, UA Negative Negative   Bilirubin, UA n    Ketones, UA n    Spec Grav, UA     Blood, UA n    pH, UA 5.0 5.0 - 8.0   Protein, UA Negative Negative   Urobilinogen, UA 0.2 0.2 or 1.0 E.U./dL   Nitrite, UA  Pos    Leukocytes, UA Small (1+) (A) Negative   Appearance     Odor    Cervicovaginal ancillary only( [place])  Result Value Ref Range   Chlamydia Negative    Neisseria Gonorrhea Negative    Trichomonas Negative     Assessment and Plan:   There are no diagnoses linked to this encounter.    ***  Jarold Motto, PA-C

## 2020-09-27 ENCOUNTER — Encounter: Payer: Self-pay | Admitting: Family Medicine

## 2022-03-26 ENCOUNTER — Encounter: Payer: Self-pay | Admitting: *Deleted

## 2022-06-14 ENCOUNTER — Encounter: Payer: Self-pay | Admitting: *Deleted

## 2023-04-07 ENCOUNTER — Emergency Department (HOSPITAL_BASED_OUTPATIENT_CLINIC_OR_DEPARTMENT_OTHER)
Admission: EM | Admit: 2023-04-07 | Discharge: 2023-04-07 | Disposition: A | Discharge status: Home / Self Care | Payer: 59 | Attending: Emergency Medicine | Admitting: Emergency Medicine

## 2023-04-07 ENCOUNTER — Other Ambulatory Visit: Payer: Self-pay

## 2023-04-07 ENCOUNTER — Encounter (HOSPITAL_BASED_OUTPATIENT_CLINIC_OR_DEPARTMENT_OTHER): Payer: Self-pay | Admitting: Emergency Medicine

## 2023-04-07 DIAGNOSIS — B3731 Acute candidiasis of vulva and vagina: Secondary | ICD-10-CM | POA: Insufficient documentation

## 2023-04-07 DIAGNOSIS — R102 Pelvic and perineal pain: Secondary | ICD-10-CM | POA: Diagnosis present

## 2023-04-07 DIAGNOSIS — B379 Candidiasis, unspecified: Secondary | ICD-10-CM

## 2023-04-07 DIAGNOSIS — N390 Urinary tract infection, site not specified: Secondary | ICD-10-CM

## 2023-04-07 LAB — WET PREP, GENITAL
Clue Cells Wet Prep HPF POC: NONE SEEN
Sperm: NONE SEEN
Trich, Wet Prep: NONE SEEN
WBC, Wet Prep HPF POC: <10 (ref <10)

## 2023-04-07 LAB — URINALYSIS, ROUTINE W REFLEX MICROSCOPIC
Bilirubin Urine: NEGATIVE
Glucose, UA: NEGATIVE mg/dL
Nitrite: NEGATIVE
Protein, ur: 30 mg/dL — AB
RBC / HPF: >50 RBC/hpf (ref 0–5)
Specific Gravity, Urine: 1.026 (ref 1.005–1.030)
WBC, UA: >50 WBC/hpf (ref 0–5)
pH: 7.5 (ref 5.0–8.0)

## 2023-04-07 LAB — PREGNANCY, URINE: Preg Test, Ur: NEGATIVE

## 2023-04-07 MED ORDER — HYDROCODONE-ACETAMINOPHEN 5-325 MG PO TABS
1.0000 | ORAL_TABLET | Freq: Once | ORAL | Status: AC
  Administered 2023-04-07: 1 via ORAL
  Filled 2023-04-07: qty 1

## 2023-04-07 MED ORDER — FLUCONAZOLE 150 MG PO TABS: 150.0000 mg | ORAL_TABLET | Freq: Once | ORAL | 0 refills | Status: AC

## 2023-04-07 MED ORDER — NITROFURANTOIN MONOHYD MACRO 100 MG PO CAPS
100.0000 mg | ORAL_CAPSULE | Freq: Once | ORAL | Status: AC
  Administered 2023-04-07: 100 mg via ORAL
  Filled 2023-04-07: qty 1

## 2023-04-07 MED ORDER — NITROFURANTOIN MONOHYD MACRO 100 MG PO CAPS: 100.0000 mg | ORAL_CAPSULE | Freq: Two times a day (BID) | ORAL | 0 refills | Status: AC

## 2023-04-07 NOTE — Discharge Instructions (Addendum)
You were seen for your urinary tract infection and yeast infection in the emergency department.   At home, please take the macrobid for your urinary tract infection and the monostat for your yeast infection.    Check your MyChart online for the results of any tests that had not resulted by the time you left the emergency department.   Follow-up with your primary doctor in 2-3 days regarding your visit.    Return immediately to the emergency department if you experience any of the following: worsening pain, fevers, vomiting, or any other concerning symptoms.    Thank you for visiting our Emergency Department. It was a pleasure taking care of you today.

## 2023-04-07 NOTE — ED Triage Notes (Signed)
Pt presents with "severe vaginal pain at the entrance" Painful urination but states it is not her urethra that is painful.  Right low back mild pain as well. Nausea from pain.

## 2023-04-07 NOTE — ED Provider Notes (Signed)
[place] EMERGENCY DEPARTMENT AT [facility] Provider Note   CSN: [id number] Arrival date & time: 04/07/23  1829     History {Add pertinent medical, surgical, social history, OB history to HPI:1} Chief Complaint  Patient presents with  . Vaginal Pain    Felicia Ellison is a 27 y.o. female.  27 year old female with a history of anxiety and depression who presents emergency department with vaginal pain, dysuria, and lower back pain.  Patient reports that for the past 2 days has been having significant dysuria.  Also reports vaginal pain when she gets up to walk.  Says that her low back has also been somewhat painful as well.  Describes it as a dull sensation that is 1/10 in severity currently.  No fevers or chills or nausea or vomiting.  Tried Monistat for symptoms but did not help.  Has not been sexually active in over a year.  Just finished her menstrual cycle and denies any vaginal bleeding or discharge.       Home Medications Prior to Admission medications   Medication Sig Start Date End Date Taking? Authorizing Provider  amoxicillin (AMOXIL) 500 MG capsule Take 1 capsule (500 mg total) by mouth 2 (two) times daily. 03/25/20   Jarold Motto, PA  gabapentin (NEURONTIN) 100 MG capsule TAKE 1 CAPSULE BY MOUTH THREE TIMES A DAY 10/12/19   Ardith Dark, MD  lisdexamfetamine (VYVANSE) 30 MG capsule Take 1 capsule (30 mg total) by mouth daily. 08/01/20   Ardith Dark, MD  predniSONE (DELTASONE) 20 MG tablet Take 2 tablets (40 mg total) by mouth daily. 03/25/20   Jarold Motto, PA  [employer] 150-35 MCG/24HR transdermal patch APPLY 1 PATCH ONCE A WEEK 05/31/20   Ardith Dark, MD      Allergies    Buspar [buspirone]    Review of Systems   Review of Systems  Physical Exam Updated Vital Signs BP 122/87 (BP Location: Right Arm)   Pulse (!) 110   Temp 98.5 F (36.9 C)   Resp 18   SpO2 100%  Physical Exam  ED Results / Procedures / Treatments   Labs (all labs  ordered are listed, but only abnormal results are displayed) Labs Reviewed  URINALYSIS, ROUTINE W REFLEX MICROSCOPIC - Abnormal; Notable for the following components:      Result Value   APPearance CLOUDY (*)    Hgb urine dipstick MODERATE (*)    Ketones, ur TRACE (*)    Protein, ur 30 (*)    Leukocytes,Ua LARGE (*)    Bacteria, UA RARE (*)    All other components within normal limits  WET PREP, GENITAL  PREGNANCY, URINE  GC/CHLAMYDIA PROBE AMP ([place]) NOT AT [facility]    EKG None  Radiology No results found.  Procedures Procedures  {Document cardiac monitor, telemetry assessment procedure when appropriate:1}  Medications Ordered in ED Medications - No data to display  ED Course/ Medical Decision Making/ A&P   {   Click here for ABCD2, HEART and other calculatorsREFRESH Note before signing :1}                              Medical Decision Making Amount and/or Complexity of Data Reviewed Labs: ordered.   ***  {Document critical care time when appropriate:1} {Document review of labs and clinical decision tools ie heart score, Chads2Vasc2 etc:1}  {Document your independent review of radiology images, and any outside records:1} {Document your  discussion with family members, caretakers, and with consultants:1} {Document social determinants of health affecting pt's care:1} {Document your decision making why or why not admission, treatments were needed:1} Final Clinical Impression(s) / ED Diagnoses Final diagnoses:  None    Rx / DC Orders ED Discharge Orders     None

## 2023-04-09 LAB — GC/CHLAMYDIA PROBE AMP (~~LOC~~) NOT AT ARMC
Chlamydia: NEGATIVE
Comment: NEGATIVE
Comment: NORMAL
Neisseria Gonorrhea: NEGATIVE

## 2023-08-01 ENCOUNTER — Other Ambulatory Visit: Payer: Self-pay | Admitting: Physician Assistant

## 2023-08-01 DIAGNOSIS — N63 Unspecified lump in unspecified breast: Secondary | ICD-10-CM

## 2023-08-21 ENCOUNTER — Ambulatory Visit
Admission: RE | Admit: 2023-08-21 | Discharge: 2023-08-21 | Disposition: A | Discharge status: Home / Self Care | Payer: No Typology Code available for payment source | Source: Ambulatory Visit | Attending: Physician Assistant | Admitting: Physician Assistant

## 2023-08-21 ENCOUNTER — Other Ambulatory Visit: Payer: 59

## 2023-08-21 DIAGNOSIS — N63 Unspecified lump in unspecified breast: Secondary | ICD-10-CM
